# Patient Record
Sex: Female | Born: 1990 | Race: White | Hispanic: No | Marital: Single | Smoking: Current some day smoker
Health system: Southern US, Community
[De-identification: ages and names within clinical notes are randomized; demographics above are authoritative.]

## PROBLEM LIST (undated history)

## (undated) DIAGNOSIS — F32A Depression, unspecified: Secondary | ICD-10-CM

## (undated) DIAGNOSIS — F419 Anxiety disorder, unspecified: Secondary | ICD-10-CM

## (undated) DIAGNOSIS — M797 Fibromyalgia: Secondary | ICD-10-CM

## (undated) DIAGNOSIS — F329 Major depressive disorder, single episode, unspecified: Secondary | ICD-10-CM

## (undated) DIAGNOSIS — S62109A Fracture of unspecified carpal bone, unspecified wrist, initial encounter for closed fracture: Secondary | ICD-10-CM

---

## 2013-10-13 HISTORY — PX: OTHER SURGICAL HISTORY: SHX169

## 2013-10-14 ENCOUNTER — Encounter (HOSPITAL_COMMUNITY): Admission: EM | Disposition: A | Discharge status: Rehabilitation Facility | Payer: Self-pay | Source: Home / Self Care

## 2013-10-14 ENCOUNTER — Observation Stay (HOSPITAL_COMMUNITY): Payer: No Typology Code available for payment source

## 2013-10-14 ENCOUNTER — Emergency Department (HOSPITAL_COMMUNITY): Payer: No Typology Code available for payment source

## 2013-10-14 ENCOUNTER — Encounter (HOSPITAL_COMMUNITY): Payer: Self-pay | Admitting: Emergency Medicine

## 2013-10-14 ENCOUNTER — Encounter (HOSPITAL_COMMUNITY): Payer: No Typology Code available for payment source | Admitting: Anesthesiology

## 2013-10-14 ENCOUNTER — Observation Stay (HOSPITAL_COMMUNITY): Payer: No Typology Code available for payment source | Admitting: Anesthesiology

## 2013-10-14 ENCOUNTER — Inpatient Hospital Stay (HOSPITAL_COMMUNITY)
Admission: EM | Admit: 2013-10-14 | Discharge: 2013-10-20 | DRG: 956 | Disposition: A | Discharge status: Rehabilitation Facility | Payer: No Typology Code available for payment source | Attending: General Surgery | Admitting: General Surgery

## 2013-10-14 ENCOUNTER — Inpatient Hospital Stay (HOSPITAL_COMMUNITY): Payer: No Typology Code available for payment source

## 2013-10-14 DIAGNOSIS — R338 Other retention of urine: Secondary | ICD-10-CM | POA: Diagnosis present

## 2013-10-14 DIAGNOSIS — R339 Retention of urine, unspecified: Secondary | ICD-10-CM | POA: Diagnosis not present

## 2013-10-14 DIAGNOSIS — S72309A Unspecified fracture of shaft of unspecified femur, initial encounter for closed fracture: Principal | ICD-10-CM | POA: Diagnosis present

## 2013-10-14 DIAGNOSIS — M959 Acquired deformity of musculoskeletal system, unspecified: Secondary | ICD-10-CM | POA: Diagnosis present

## 2013-10-14 DIAGNOSIS — S7292XA Unspecified fracture of left femur, initial encounter for closed fracture: Secondary | ICD-10-CM

## 2013-10-14 DIAGNOSIS — S92001A Unspecified fracture of right calcaneus, initial encounter for closed fracture: Secondary | ICD-10-CM

## 2013-10-14 DIAGNOSIS — F17200 Nicotine dependence, unspecified, uncomplicated: Secondary | ICD-10-CM | POA: Diagnosis present

## 2013-10-14 DIAGNOSIS — S42009A Fracture of unspecified part of unspecified clavicle, initial encounter for closed fracture: Secondary | ICD-10-CM

## 2013-10-14 DIAGNOSIS — S92101A Unspecified fracture of right talus, initial encounter for closed fracture: Secondary | ICD-10-CM

## 2013-10-14 DIAGNOSIS — S72302A Unspecified fracture of shaft of left femur, initial encounter for closed fracture: Secondary | ICD-10-CM

## 2013-10-14 DIAGNOSIS — I498 Other specified cardiac arrhythmias: Secondary | ICD-10-CM | POA: Diagnosis present

## 2013-10-14 DIAGNOSIS — S92009A Unspecified fracture of unspecified calcaneus, initial encounter for closed fracture: Secondary | ICD-10-CM | POA: Diagnosis present

## 2013-10-14 DIAGNOSIS — S92102A Unspecified fracture of left talus, initial encounter for closed fracture: Secondary | ICD-10-CM

## 2013-10-14 DIAGNOSIS — F1210 Cannabis abuse, uncomplicated: Secondary | ICD-10-CM | POA: Diagnosis present

## 2013-10-14 DIAGNOSIS — E669 Obesity, unspecified: Secondary | ICD-10-CM | POA: Insufficient documentation

## 2013-10-14 DIAGNOSIS — IMO0001 Reserved for inherently not codable concepts without codable children: Secondary | ICD-10-CM

## 2013-10-14 DIAGNOSIS — S92109A Unspecified fracture of unspecified talus, initial encounter for closed fracture: Secondary | ICD-10-CM | POA: Diagnosis present

## 2013-10-14 DIAGNOSIS — S2691XA Contusion of heart, unspecified with or without hemopericardium, initial encounter: Secondary | ICD-10-CM

## 2013-10-14 DIAGNOSIS — Z6841 Body Mass Index (BMI) 40.0 and over, adult: Secondary | ICD-10-CM

## 2013-10-14 DIAGNOSIS — S7290XA Unspecified fracture of unspecified femur, initial encounter for closed fracture: Secondary | ICD-10-CM

## 2013-10-14 HISTORY — DX: Fracture of unspecified carpal bone, unspecified wrist, initial encounter for closed fracture: S62.109A

## 2013-10-14 HISTORY — PX: FEMUR IM NAIL: SHX1597

## 2013-10-14 LAB — CBC WITH DIFFERENTIAL/PLATELET
Basophils Absolute: 0 10*3/uL (ref 0.0–0.1)
Eosinophils Absolute: 0.2 10*3/uL (ref 0.0–0.7)
Eosinophils Relative: 2 % (ref 0–5)
Hemoglobin: 14.3 g/dL (ref 12.0–15.0)
Lymphocytes Relative: 28 % (ref 12–46)
Lymphs Abs: 4.5 10*3/uL — ABNORMAL HIGH (ref 0.7–4.0)
MCV: 87.4 fL (ref 78.0–100.0)
Neutrophils Relative %: 65 % (ref 43–77)
Platelets: 253 10*3/uL (ref 150–400)
RDW: 13.1 % (ref 11.5–15.5)
WBC: 15.8 10*3/uL — ABNORMAL HIGH (ref 4.0–10.5)

## 2013-10-14 LAB — COMPREHENSIVE METABOLIC PANEL
ALT: 30 U/L (ref 0–35)
AST: 39 U/L — ABNORMAL HIGH (ref 0–37)
Alkaline Phosphatase: 66 U/L (ref 39–117)
Calcium: 9.2 mg/dL (ref 8.4–10.5)
Creatinine, Ser: 0.84 mg/dL (ref 0.50–1.10)
GFR calc Af Amer: >90 mL/min (ref >90)
Glucose, Bld: 99 mg/dL (ref 70–99)
Potassium: 4.3 mEq/L (ref 3.5–5.1)
Sodium: 139 mEq/L (ref 135–145)
Total Bilirubin: 0.3 mg/dL (ref 0.3–1.2)
Total Protein: 6.8 g/dL (ref 6.0–8.3)

## 2013-10-14 LAB — POCT I-STAT, CHEM 8
BUN: 16 mg/dL (ref 6–23)
Chloride: 106 mEq/L (ref 96–112)
Creatinine, Ser: 1.1 mg/dL (ref 0.50–1.10)
HCT: 43 % (ref 36.0–46.0)
Potassium: 4.4 mEq/L (ref 3.5–5.1)
Sodium: 141 mEq/L (ref 135–145)

## 2013-10-14 LAB — CK TOTAL AND CKMB (NOT AT ARMC)
CK, MB: 6.8 ng/mL (ref 0.3–4.0)
Relative Index: 0.7 (ref 0.0–2.5)
Total CK: 993 U/L — ABNORMAL HIGH (ref 7–177)

## 2013-10-14 LAB — URINALYSIS, ROUTINE W REFLEX MICROSCOPIC
Bilirubin Urine: NEGATIVE
Glucose, UA: NEGATIVE mg/dL
Ketones, ur: NEGATIVE mg/dL
Nitrite: NEGATIVE
Protein, ur: NEGATIVE mg/dL
Specific Gravity, Urine: 1.031 — ABNORMAL HIGH (ref 1.005–1.030)
pH: 6 (ref 5.0–8.0)

## 2013-10-14 LAB — SAMPLE TO BLOOD BANK

## 2013-10-14 LAB — URINE MICROSCOPIC-ADD ON

## 2013-10-14 LAB — ETHANOL: Alcohol, Ethyl (B): <11 mg/dL (ref 0–11)

## 2013-10-14 LAB — POCT PREGNANCY, URINE: Preg Test, Ur: NEGATIVE

## 2013-10-14 LAB — CDS SEROLOGY

## 2013-10-14 SURGERY — INSERTION, INTRAMEDULLARY ROD, FEMUR
Anesthesia: General | Site: Leg Upper | Laterality: Left | Wound class: Clean

## 2013-10-14 SURGERY — CANCELLED PROCEDURE
Laterality: Left

## 2013-10-14 MED ORDER — ONDANSETRON HCL 4 MG PO TABS: 4.0000 mg | ORAL_TABLET | Freq: Four times a day (QID) | ORAL | Status: DC | PRN

## 2013-10-14 MED ORDER — TETANUS-DIPHTH-ACELL PERTUSSIS 5-2.5-18.5 LF-MCG/0.5 IM SUSP
0.5000 mL | Freq: Once | INTRAMUSCULAR | Status: AC
  Administered 2013-10-14: 0.5 mL via INTRAMUSCULAR

## 2013-10-14 MED ORDER — WARFARIN - PHARMACIST DOSING INPATIENT
Freq: Every day | Status: DC
  Administered 2013-10-18: 18:00:00

## 2013-10-14 MED ORDER — MORPHINE SULFATE 4 MG/ML IJ SOLN: 4.0000 mg | Freq: Once | INTRAMUSCULAR | Status: AC

## 2013-10-14 MED ORDER — ONDANSETRON HCL 4 MG/2ML IJ SOLN: 4.0000 mg | Freq: Once | INTRAMUSCULAR | Status: DC | PRN

## 2013-10-14 MED ORDER — LACTATED RINGERS IV SOLN
INTRAVENOUS | Status: DC | PRN
  Administered 2013-10-14: 10:00:00 via INTRAVENOUS

## 2013-10-14 MED ORDER — HYDROMORPHONE HCL PF 1 MG/ML IJ SOLN
INTRAMUSCULAR | Status: AC
  Filled 2013-10-14: qty 1

## 2013-10-14 MED ORDER — ROCURONIUM BROMIDE 100 MG/10ML IV SOLN
INTRAVENOUS | Status: DC | PRN
  Administered 2013-10-14: 10 mg via INTRAVENOUS
  Administered 2013-10-14: 30 mg via INTRAVENOUS
  Administered 2013-10-14: 10 mg via INTRAVENOUS

## 2013-10-14 MED ORDER — WARFARIN VIDEO
Freq: Once | Status: AC
  Administered 2013-10-14: 17:00:00

## 2013-10-14 MED ORDER — MORPHINE SULFATE 2 MG/ML IJ SOLN
INTRAMUSCULAR | Status: AC
  Administered 2013-10-14: 4 mg via INTRAVENOUS
  Filled 2013-10-14: qty 2

## 2013-10-14 MED ORDER — HYDROMORPHONE HCL PF 1 MG/ML IJ SOLN: 0.5000 mg | INTRAMUSCULAR | Status: DC | PRN

## 2013-10-14 MED ORDER — ONDANSETRON HCL 4 MG/2ML IJ SOLN: 4.0000 mg | Freq: Four times a day (QID) | INTRAMUSCULAR | Status: DC | PRN

## 2013-10-14 MED ORDER — 0.9 % SODIUM CHLORIDE (POUR BTL) OPTIME
TOPICAL | Status: DC | PRN
  Administered 2013-10-14: 1000 mL

## 2013-10-14 MED ORDER — HYDROMORPHONE HCL PF 1 MG/ML IJ SOLN
1.0000 mg | INTRAMUSCULAR | Status: DC | PRN
  Administered 2013-10-14 (×2): 1 mg via INTRAVENOUS
  Filled 2013-10-14 (×2): qty 1

## 2013-10-14 MED ORDER — CEFAZOLIN SODIUM-DEXTROSE 2-3 GM-% IV SOLR
INTRAVENOUS | Status: AC
  Administered 2013-10-14: 3 g via INTRAVENOUS
  Filled 2013-10-14: qty 50

## 2013-10-14 MED ORDER — OXYCODONE-ACETAMINOPHEN 5-325 MG PO TABS
1.0000 | ORAL_TABLET | ORAL | Status: DC | PRN
  Administered 2013-10-14 – 2013-10-15 (×4): 2 via ORAL
  Filled 2013-10-14 (×4): qty 2

## 2013-10-14 MED ORDER — PANTOPRAZOLE SODIUM 40 MG PO TBEC: 40.0000 mg | DELAYED_RELEASE_TABLET | Freq: Every day | ORAL | Status: DC

## 2013-10-14 MED ORDER — SODIUM CHLORIDE 0.9 % IV SOLN
INTRAVENOUS | Status: AC | PRN
  Administered 2013-10-14 (×3): 1000 mL via INTRAVENOUS

## 2013-10-14 MED ORDER — DEXTROSE-NACL 5-0.9 % IV SOLN
INTRAVENOUS | Status: DC
  Administered 2013-10-14: 06:00:00 via INTRAVENOUS

## 2013-10-14 MED ORDER — SUCCINYLCHOLINE CHLORIDE 20 MG/ML IJ SOLN
INTRAMUSCULAR | Status: DC | PRN
  Administered 2013-10-14: 100 mg via INTRAVENOUS

## 2013-10-14 MED ORDER — MORPHINE SULFATE 2 MG/ML IJ SOLN
INTRAMUSCULAR | Status: AC | PRN
  Administered 2013-10-14 (×4): 4 mg via INTRAVENOUS

## 2013-10-14 MED ORDER — PROPOFOL 10 MG/ML IV BOLUS
INTRAVENOUS | Status: DC | PRN
  Administered 2013-10-14 (×2): 20 mg via INTRAVENOUS
  Administered 2013-10-14: 200 mg via INTRAVENOUS

## 2013-10-14 MED ORDER — CEFAZOLIN SODIUM 1-5 GM-% IV SOLN
INTRAVENOUS | Status: AC
  Filled 2013-10-14: qty 50

## 2013-10-14 MED ORDER — HYDROMORPHONE HCL PF 1 MG/ML IJ SOLN
INTRAMUSCULAR | Status: AC
  Administered 2013-10-14: 0.5 mg via INTRAVENOUS
  Filled 2013-10-14: qty 1

## 2013-10-14 MED ORDER — METOCLOPRAMIDE HCL 5 MG PO TABS
5.0000 mg | ORAL_TABLET | Freq: Three times a day (TID) | ORAL | Status: DC | PRN
Start: 2013-10-14 — End: 2013-10-20

## 2013-10-14 MED ORDER — METOCLOPRAMIDE HCL 5 MG/ML IJ SOLN: 5.0000 mg | Freq: Three times a day (TID) | INTRAMUSCULAR | Status: DC | PRN

## 2013-10-14 MED ORDER — HYDROMORPHONE HCL PF 1 MG/ML IJ SOLN
INTRAMUSCULAR | Status: AC | PRN
  Administered 2013-10-14: 1 mg
  Administered 2013-10-14: 1 mg via INTRAVENOUS

## 2013-10-14 MED ORDER — IOHEXOL 300 MG/ML  SOLN: 100.0000 mL | Freq: Once | INTRAMUSCULAR | Status: AC | PRN

## 2013-10-14 MED ORDER — HYDROMORPHONE HCL PF 1 MG/ML IJ SOLN
0.2500 mg | INTRAMUSCULAR | Status: DC | PRN
  Administered 2013-10-14 (×6): 0.5 mg via INTRAVENOUS

## 2013-10-14 MED ORDER — COUMADIN BOOK
Freq: Once | Status: AC
  Administered 2013-10-14: 17:00:00
  Filled 2013-10-14: qty 1

## 2013-10-14 MED ORDER — NEOSTIGMINE METHYLSULFATE 1 MG/ML IJ SOLN
INTRAMUSCULAR | Status: DC | PRN
  Administered 2013-10-14: 3 mg via INTRAVENOUS

## 2013-10-14 MED ORDER — MORPHINE SULFATE 2 MG/ML IJ SOLN
INTRAMUSCULAR | Status: AC
  Filled 2013-10-14: qty 2

## 2013-10-14 MED ORDER — LACTATED RINGERS IV SOLN
INTRAVENOUS | Status: DC
  Administered 2013-10-14: 10:00:00 via INTRAVENOUS

## 2013-10-14 MED ORDER — SODIUM CHLORIDE 0.9 % IV SOLN
INTRAVENOUS | Status: DC
  Administered 2013-10-14: 10 mL/h via INTRAVENOUS

## 2013-10-14 MED ORDER — TETANUS-DIPHTH-ACELL PERTUSSIS 5-2.5-18.5 LF-MCG/0.5 IM SUSP
INTRAMUSCULAR | Status: AC
  Filled 2013-10-14: qty 0.5

## 2013-10-14 MED ORDER — CEFAZOLIN SODIUM-DEXTROSE 2-3 GM-% IV SOLR
2.0000 g | Freq: Four times a day (QID) | INTRAVENOUS | Status: AC
  Administered 2013-10-14 – 2013-10-15 (×3): 2 g via INTRAVENOUS
  Filled 2013-10-14 (×4): qty 50

## 2013-10-14 MED ORDER — FENTANYL CITRATE 0.05 MG/ML IJ SOLN
INTRAMUSCULAR | Status: DC | PRN
  Administered 2013-10-14 (×7): 50 ug via INTRAVENOUS

## 2013-10-14 MED ORDER — MORPHINE SULFATE 2 MG/ML IJ SOLN
INTRAMUSCULAR | Status: AC
  Filled 2013-10-14: qty 1

## 2013-10-14 MED ORDER — ONDANSETRON HCL 4 MG/2ML IJ SOLN
INTRAMUSCULAR | Status: DC | PRN
  Administered 2013-10-14: 4 mg via INTRAVENOUS

## 2013-10-14 MED ORDER — PANTOPRAZOLE SODIUM 40 MG IV SOLR
40.0000 mg | Freq: Every day | INTRAVENOUS | Status: DC
  Filled 2013-10-14: qty 40

## 2013-10-14 MED ORDER — HYDROMORPHONE HCL PF 1 MG/ML IJ SOLN: 0.2500 mg | INTRAMUSCULAR | Status: DC | PRN

## 2013-10-14 MED ORDER — TETANUS-DIPHTHERIA TOXOIDS TD 5-2 LFU IM INJ: 0.5000 mL | INJECTION | Freq: Once | INTRAMUSCULAR | Status: DC

## 2013-10-14 MED ORDER — GLYCOPYRROLATE 0.2 MG/ML IJ SOLN
INTRAMUSCULAR | Status: DC | PRN
  Administered 2013-10-14: 0.4 mg via INTRAVENOUS

## 2013-10-14 MED ORDER — WARFARIN SODIUM 7.5 MG PO TABS
7.5000 mg | ORAL_TABLET | Freq: Once | ORAL | Status: AC
  Administered 2013-10-14: 7.5 mg via ORAL
  Filled 2013-10-14 (×2): qty 1

## 2013-10-14 MED ORDER — LIDOCAINE HCL (CARDIAC) 20 MG/ML IV SOLN
INTRAVENOUS | Status: DC | PRN
  Administered 2013-10-14: 100 mg via INTRAVENOUS

## 2013-10-14 MED ORDER — HYDROMORPHONE HCL PF 1 MG/ML IJ SOLN
1.0000 mg | Freq: Once | INTRAMUSCULAR | Status: AC
  Administered 2013-10-14: 1 mg via INTRAVENOUS
  Filled 2013-10-14: qty 1

## 2013-10-14 MED ORDER — HYDROMORPHONE HCL PF 1 MG/ML IJ SOLN
INTRAMUSCULAR | Status: AC
  Administered 2013-10-14: 1 mg
  Filled 2013-10-14: qty 1

## 2013-10-14 SURGICAL SUPPLY — 49 items
BIT DRILL LONG 4.0 (BIT) ×1 IMPLANT
BIT DRILL SHORT 4.0 (BIT) ×1 IMPLANT
BLADE SURG 15 STRL LF DISP TIS (BLADE) ×1 IMPLANT
BLADE SURG 15 STRL SS (BLADE) ×1
CLOTH BEACON ORANGE TIMEOUT ST (SAFETY) IMPLANT
COVER SURGICAL LIGHT HANDLE (MISCELLANEOUS) ×2 IMPLANT
COVER TABLE BACK 60X90 (DRAPES) IMPLANT
DRAPE C-ARM 42X72 X-RAY (DRAPES) IMPLANT
DRAPE STERI IOBAN 125X83 (DRAPES) ×2 IMPLANT
DRILL BIT LONG 4.0 (BIT) ×2
DRILL BIT SHORT 4.0 (BIT) ×1
DRSG ADAPTIC 3X8 NADH LF (GAUZE/BANDAGES/DRESSINGS) IMPLANT
DRSG MEPILEX BORDER 4X12 (GAUZE/BANDAGES/DRESSINGS) ×2 IMPLANT
DRSG MEPILEX BORDER 4X4 (GAUZE/BANDAGES/DRESSINGS) IMPLANT
DRSG MEPILEX BORDER 4X8 (GAUZE/BANDAGES/DRESSINGS) IMPLANT
ELECT REM PT RETURN 9FT ADLT (ELECTROSURGICAL) ×2
ELECTRODE REM PT RTRN 9FT ADLT (ELECTROSURGICAL) ×1 IMPLANT
EVACUATOR 1/8 PVC DRAIN (DRAIN) IMPLANT
GLOVE BIO SURGEON STRL SZ 6.5 (GLOVE) ×2 IMPLANT
GLOVE BIOGEL PI IND STRL 7.0 (GLOVE) ×1 IMPLANT
GLOVE BIOGEL PI IND STRL 7.5 (GLOVE) ×1 IMPLANT
GLOVE BIOGEL PI IND STRL 9 (GLOVE) ×1 IMPLANT
GLOVE BIOGEL PI INDICATOR 7.0 (GLOVE) ×1
GLOVE BIOGEL PI INDICATOR 7.5 (GLOVE) ×1
GLOVE BIOGEL PI INDICATOR 9 (GLOVE) ×1
GLOVE ECLIPSE 6.5 STRL STRAW (GLOVE) ×2 IMPLANT
GLOVE SURG ORTHO 9.0 STRL STRW (GLOVE) ×2 IMPLANT
GOWN PREVENTION PLUS XLARGE (GOWN DISPOSABLE) IMPLANT
GOWN SRG XL XLNG 56XLVL 4 (GOWN DISPOSABLE) ×3 IMPLANT
GOWN STRL NON-REIN XL XLG LVL4 (GOWN DISPOSABLE) ×3
GUIDE PIN 3.2MM (MISCELLANEOUS) ×1
GUIDE PIN ORTH 343X3.2XBRAD (MISCELLANEOUS) ×1 IMPLANT
GUIDE ROD 3.0 (MISCELLANEOUS) ×2
KIT BASIN OR (CUSTOM PROCEDURE TRAY) ×2 IMPLANT
KIT ROOM TURNOVER OR (KITS) ×2 IMPLANT
MANIFOLD NEPTUNE II (INSTRUMENTS) ×2 IMPLANT
NAIL TAN 10.X34 (Nail) ×2 IMPLANT
NS IRRIG 1000ML POUR BTL (IV SOLUTION) ×2 IMPLANT
PACK GENERAL/GYN (CUSTOM PROCEDURE TRAY) ×2 IMPLANT
PAD ARMBOARD 7.5X6 YLW CONV (MISCELLANEOUS) ×4 IMPLANT
ROD GUIDE 3.0 (MISCELLANEOUS) ×1 IMPLANT
SCREW TRIGEN LOW PROF 5.0X60 (Screw) ×2 IMPLANT
STAPLER VISISTAT 35W (STAPLE) ×2 IMPLANT
SUT VIC AB 0 CT1 27 (SUTURE) ×2
SUT VIC AB 0 CT1 27XBRD ANBCTR (SUTURE) ×2 IMPLANT
SUT VIC AB 1 CT1 27 (SUTURE) ×1
SUT VIC AB 1 CT1 27XBRD ANBCTR (SUTURE) ×1 IMPLANT
SUT VIC AB 2-0 CTB1 (SUTURE) ×2 IMPLANT
WATER STERILE IRR 1000ML POUR (IV SOLUTION) IMPLANT

## 2013-10-14 SURGICAL SUPPLY — 28 items
CLOTH BEACON ORANGE TIMEOUT ST (SAFETY) ×2 IMPLANT
COVER SURGICAL LIGHT HANDLE (MISCELLANEOUS) ×2 IMPLANT
DRAPE PROXIMA HALF (DRAPES) ×4 IMPLANT
DRAPE STERI IOBAN 125X83 (DRAPES) IMPLANT
DRSG ADAPTIC 3X8 NADH LF (GAUZE/BANDAGES/DRESSINGS) ×2 IMPLANT
DRSG MEPILEX BORDER 4X4 (GAUZE/BANDAGES/DRESSINGS) ×2 IMPLANT
DRSG MEPILEX BORDER 4X8 (GAUZE/BANDAGES/DRESSINGS) IMPLANT
DURAPREP 26ML APPLICATOR (WOUND CARE) ×2 IMPLANT
ELECT REM PT RETURN 9FT ADLT (ELECTROSURGICAL) ×2
ELECTRODE REM PT RTRN 9FT ADLT (ELECTROSURGICAL) ×1 IMPLANT
EVACUATOR 1/8 PVC DRAIN (DRAIN) IMPLANT
GLOVE BIO SURGEON STRL SZ 6.5 (GLOVE) ×2 IMPLANT
GLOVE SURG SS PI 8.0 STRL IVOR (GLOVE) ×4 IMPLANT
GOWN EXTRA PROTECTION XL (GOWNS) ×2 IMPLANT
GOWN STRL NON-REIN LRG LVL3 (GOWN DISPOSABLE) ×6 IMPLANT
KIT BASIN OR (CUSTOM PROCEDURE TRAY) ×2 IMPLANT
KIT ROOM TURNOVER OR (KITS) ×2 IMPLANT
MANIFOLD NEPTUNE II (INSTRUMENTS) ×2 IMPLANT
NS IRRIG 1000ML POUR BTL (IV SOLUTION) ×2 IMPLANT
PACK GENERAL/GYN (CUSTOM PROCEDURE TRAY) ×2 IMPLANT
PAD ARMBOARD 7.5X6 YLW CONV (MISCELLANEOUS) ×4 IMPLANT
SPONGE GAUZE 4X4 12PLY (GAUZE/BANDAGES/DRESSINGS) ×2 IMPLANT
STAPLER VISISTAT (STAPLE) ×2 IMPLANT
SUT VIC AB 0 CTB1 27 (SUTURE) ×4 IMPLANT
SUT VIC AB 1 CTB1 27 (SUTURE) ×4 IMPLANT
SUT VIC AB 2-0 CTB1 (SUTURE) ×4 IMPLANT
TAPE STRIPS DRAPE STRL (GAUZE/BANDAGES/DRESSINGS) ×2 IMPLANT
WATER STERILE IRR 1000ML POUR (IV SOLUTION) ×6 IMPLANT

## 2013-10-14 NOTE — Progress Notes (Signed)
Orthopedic Tech Progress Note Patient Details:  Amy Clark August 05, 1991 [id number]   bucks traction 10lbs   Amy Clark 10/14/2013, 3:40 AM
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
## 2013-10-14 NOTE — Progress Notes (Signed)
UR completed 
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
## 2013-10-14 NOTE — Preoperative (Signed)
Beta Blockers   Reason not to administer Beta Blockers:Not Applicable 

## 2013-10-14 NOTE — Progress Notes (Signed)
Give more dilaudid per Dr Katrinka Blazing
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
## 2013-10-14 NOTE — ED Notes (Signed)
Foley catheter inserted using sterile technique; clear urine returned

## 2013-10-14 NOTE — Anesthesia Postprocedure Evaluation (Signed)
  Anesthesia Post-op Note  Patient: Amy Clark  Procedure(s) Performed: Procedure(s): INTRAMEDULLARY (IM) NAIL FEMORAL (Left)  Patient Location: PACU  Anesthesia Type:General  Level of Consciousness: awake, alert , oriented and patient cooperative  Airway and Oxygen Therapy: Patient Spontanous Breathing  Post-op Pain: moderate  Post-op Assessment: Post-op Vital signs reviewed, Patient's Cardiovascular Status Stable, Respiratory Function Stable, Patent Airway, No signs of Nausea or vomiting and Pain level controlled  Post-op Vital Signs: stable  Complications: No apparent anesthesia complications

## 2013-10-14 NOTE — Progress Notes (Signed)
Pt was alert and talking when I arrived. Talked w/pt's boyfriend who was in car w/pt but said he was ok. Pt's mother and father arrived during visit. Pt's boyfriend and parents were very supportive and comforting to pt. Pt and family were appreciative of visit and Chaplain support. Marjory Lies Chaplain  10/14/13 0100  Clinical Encounter Type  Visited With Patient and family together
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
## 2013-10-14 NOTE — Anesthesia Preprocedure Evaluation (Signed)
Anesthesia Evaluation  Patient identified by MRN, date of birth, ID band Patient awake    Reviewed: Allergy & Precautions, H&P , NPO status , Patient's Chart, lab work & pertinent test results  Airway       Dental   Pulmonary Current Smoker,          Cardiovascular     Neuro/Psych    GI/Hepatic   Endo/Other  Morbid obesity  Renal/GU      Musculoskeletal   Abdominal   Peds  Hematology   Anesthesia Other Findings   Reproductive/Obstetrics                           Anesthesia Physical Anesthesia Plan  ASA: II  Anesthesia Plan: General   Post-op Pain Management:    Induction: Intravenous  Airway Management Planned: Oral ETT  Additional Equipment:   Intra-op Plan:   Post-operative Plan: Extubation in OR  Informed Consent: I have reviewed the patients History and Physical, chart, labs and discussed the procedure including the risks, benefits and alternatives for the proposed anesthesia with the patient or authorized representative who has indicated his/her understanding and acceptance.     Plan Discussed with: CRNA, Anesthesiologist and Surgeon  Anesthesia Plan Comments:         Anesthesia Quick Evaluation

## 2013-10-14 NOTE — ED Notes (Signed)
Family at beside. Family given emotional support. Family speaking with Dr. Preston Fleeting regarding plan of care for patient
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
## 2013-10-14 NOTE — Anesthesia Procedure Notes (Signed)
Procedure Name: Intubation Date/Time: 10/14/2013 10:17 AM Performed by: Lovie Chol Pre-anesthesia Checklist: Patient identified, Emergency Drugs available, Suction available, Patient being monitored and Timeout performed Patient Re-evaluated:Patient Re-evaluated prior to inductionOxygen Delivery Method: Circle system utilized Preoxygenation: Pre-oxygenation with 100% oxygen Intubation Type: IV induction Ventilation: Mask ventilation without difficulty Laryngoscope Size: Miller and 2 Grade View: Grade I Tube type: Oral Tube size: 7.5 mm Number of attempts: 1 Airway Equipment and Method: Stylet Placement Confirmation: ETT inserted through vocal cords under direct vision,  positive ETCO2,  CO2 detector and breath sounds checked- equal and bilateral Secured at: 21 cm Tube secured with: Tape Dental Injury: Teeth and Oropharynx as per pre-operative assessment

## 2013-10-14 NOTE — ED Notes (Signed)
Paged Ortho Tech for [employer] Traction with 20lbs.

## 2013-10-14 NOTE — Transfer of Care (Signed)
Immediate Anesthesia Transfer of Care Note  Patient: Amy Clark  Procedure(s) Performed: Procedure(s): INTRAMEDULLARY (IM) NAIL FEMORAL (Left)  Patient Location: PACU  Anesthesia Type:General  Level of Consciousness: awake, oriented and patient cooperative  Airway & Oxygen Therapy: Patient Spontanous Breathing and Patient connected to face mask oxygen  Post-op Assessment: Report given to PACU RN and Post -op Vital signs reviewed and stable  Post vital signs: Reviewed  Complications: No apparent anesthesia complications
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
## 2013-10-14 NOTE — Progress Notes (Signed)
CK-MB fraction not as much of a concern since ratio to 993 is < 2.0%.  Will await CT of her ankle.  This patient has been seen and I agree with the findings and treatment plan.  Marta Lamas. Gae Bon, MD, FACS [phone] (pager) [phone] (direct pager) Trauma Surgeon

## 2013-10-15 ENCOUNTER — Other Ambulatory Visit (HOSPITAL_COMMUNITY): Payer: Self-pay | Admitting: Orthopedic Surgery

## 2013-10-15 DIAGNOSIS — S2691XA Contusion of heart, unspecified with or without hemopericardium, initial encounter: Secondary | ICD-10-CM

## 2013-10-15 DIAGNOSIS — S92101A Unspecified fracture of right talus, initial encounter for closed fracture: Secondary | ICD-10-CM

## 2013-10-15 LAB — PROTIME-INR: INR: 1.07 (ref 0.00–1.49)

## 2013-10-15 MED ORDER — OXYCODONE HCL 5 MG PO TABS
10.0000 mg | ORAL_TABLET | ORAL | Status: DC | PRN
  Administered 2013-10-15 – 2013-10-16 (×4): 20 mg via ORAL
  Administered 2013-10-16: 15 mg via ORAL
  Administered 2013-10-16 – 2013-10-20 (×13): 20 mg via ORAL
  Administered 2013-10-20: 10 mg via ORAL
  Filled 2013-10-15 (×10): qty 4
  Filled 2013-10-15: qty 2
  Filled 2013-10-15 (×8): qty 4

## 2013-10-15 MED ORDER — DOCUSATE SODIUM 100 MG PO CAPS
100.0000 mg | ORAL_CAPSULE | Freq: Two times a day (BID) | ORAL | Status: DC
  Administered 2013-10-15 – 2013-10-20 (×11): 100 mg via ORAL
  Filled 2013-10-15 (×11): qty 1

## 2013-10-15 MED ORDER — WARFARIN SODIUM 7.5 MG PO TABS
7.5000 mg | ORAL_TABLET | Freq: Once | ORAL | Status: AC
  Administered 2013-10-15: 7.5 mg via ORAL
  Filled 2013-10-15: qty 1

## 2013-10-15 MED ORDER — HYDROMORPHONE HCL PF 1 MG/ML IJ SOLN
0.5000 mg | INTRAMUSCULAR | Status: DC | PRN
  Administered 2013-10-15 – 2013-10-17 (×5): 0.5 mg via INTRAVENOUS
  Filled 2013-10-15 (×5): qty 1

## 2013-10-15 MED ORDER — DEXTROSE 5 % IV SOLN
3.0000 g | INTRAVENOUS | Status: AC
  Administered 2013-10-16: 3 g via INTRAVENOUS
  Filled 2013-10-15: qty 3000

## 2013-10-15 MED ORDER — BETHANECHOL CHLORIDE 25 MG PO TABS
25.0000 mg | ORAL_TABLET | Freq: Four times a day (QID) | ORAL | Status: DC
  Administered 2013-10-15 – 2013-10-19 (×18): 25 mg via ORAL
  Filled 2013-10-15 (×26): qty 1

## 2013-10-15 MED ORDER — POLYETHYLENE GLYCOL 3350 17 G PO PACK
17.0000 g | PACK | Freq: Every day | ORAL | Status: DC
  Administered 2013-10-15 – 2013-10-20 (×5): 17 g via ORAL
  Filled 2013-10-15 (×6): qty 1

## 2013-10-15 MED ORDER — ENOXAPARIN SODIUM 40 MG/0.4ML ~~LOC~~ SOLN
40.0000 mg | Freq: Two times a day (BID) | SUBCUTANEOUS | Status: DC
  Administered 2013-10-15 – 2013-10-20 (×8): 40 mg via SUBCUTANEOUS
  Filled 2013-10-15 (×14): qty 0.4
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
## 2013-10-15 NOTE — Progress Notes (Signed)
Good pain control. NWB RLE with talus and calcaneus FXs. Dr. Lajoyce Corners managing that as well. Patient examined and I agree with the assessment and plan  Violeta Gelinas, MD, MPH, FACS Pager: [phone]  10/15/2013 11:53 AM

## 2013-10-15 NOTE — Progress Notes (Signed)
Patient ID: Amy Clark, female   DOB: [birth year], 22 y.o.   MRN: [id number] CT scan of the right hindfoot was reviewed. Patient has a fracture of the sustentaculum tali of the calcaneus. There is a significant amount comminution and displacement.  I reviewed this with the patient discussed nonoperative treatment versus open reduction internal fixation. Discussed that the fracture fragments are directly adjacent to the neurovascular structures. Discussed that with retraction of the neurovascular structures there is a risk of numbness on the bottom of her foot for possibly 6-12 months. Discussed his risk of infection risk of nonhealing of the bone. Discussed risk of wound healing. Patient states she understands she states she wished to proceed with surgical intervention at this time.  We will plan for open reduction internal fixation of the sustentaculum tali Friday afternoon. N.p.o. after midnight tonight.
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
## 2013-10-15 NOTE — Progress Notes (Signed)
Cleared pt.'s IV pump, intake of NS was . Vanice Sarah
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
## 2013-10-16 ENCOUNTER — Encounter (HOSPITAL_COMMUNITY): Payer: No Typology Code available for payment source | Admitting: Anesthesiology

## 2013-10-16 ENCOUNTER — Encounter (HOSPITAL_COMMUNITY): Payer: Self-pay | Admitting: Orthopedic Surgery

## 2013-10-16 ENCOUNTER — Inpatient Hospital Stay (HOSPITAL_COMMUNITY): Payer: No Typology Code available for payment source | Admitting: Anesthesiology

## 2013-10-16 ENCOUNTER — Encounter (HOSPITAL_COMMUNITY): Admission: EM | Disposition: A | Discharge status: Rehabilitation Facility | Payer: Self-pay | Source: Home / Self Care

## 2013-10-16 ENCOUNTER — Inpatient Hospital Stay (HOSPITAL_COMMUNITY): Payer: No Typology Code available for payment source

## 2013-10-16 DIAGNOSIS — R339 Retention of urine, unspecified: Secondary | ICD-10-CM

## 2013-10-16 DIAGNOSIS — S92102A Unspecified fracture of left talus, initial encounter for closed fracture: Secondary | ICD-10-CM

## 2013-10-16 DIAGNOSIS — M25579 Pain in unspecified ankle and joints of unspecified foot: Secondary | ICD-10-CM

## 2013-10-16 DIAGNOSIS — S92001A Unspecified fracture of right calcaneus, initial encounter for closed fracture: Secondary | ICD-10-CM

## 2013-10-16 DIAGNOSIS — R338 Other retention of urine: Secondary | ICD-10-CM | POA: Diagnosis present

## 2013-10-16 HISTORY — PX: ORIF CALCANEOUS FRACTURE: SHX5030

## 2013-10-16 LAB — SURGICAL PCR SCREEN
MRSA, PCR: NEGATIVE
Staphylococcus aureus: POSITIVE — AB

## 2013-10-16 SURGERY — OPEN REDUCTION INTERNAL FIXATION (ORIF) CALCANEOUS FRACTURE
Anesthesia: General | Site: Foot | Laterality: Right | Wound class: Clean

## 2013-10-16 MED ORDER — METOCLOPRAMIDE HCL 5 MG PO TABS: 5.0000 mg | ORAL_TABLET | Freq: Three times a day (TID) | ORAL | Status: DC | PRN

## 2013-10-16 MED ORDER — ONDANSETRON HCL 4 MG PO TABS: 4.0000 mg | ORAL_TABLET | Freq: Four times a day (QID) | ORAL | Status: DC | PRN

## 2013-10-16 MED ORDER — FENTANYL CITRATE 0.05 MG/ML IJ SOLN
INTRAMUSCULAR | Status: DC | PRN
  Administered 2013-10-16: 100 ug via INTRAVENOUS
  Administered 2013-10-16 (×2): 50 ug via INTRAVENOUS
  Administered 2013-10-16 (×2): 100 ug via INTRAVENOUS
  Administered 2013-10-16: 50 ug via INTRAVENOUS

## 2013-10-16 MED ORDER — LACTATED RINGERS IV SOLN
INTRAVENOUS | Status: DC
  Administered 2013-10-16: 18:00:00 via INTRAVENOUS

## 2013-10-16 MED ORDER — METOCLOPRAMIDE HCL 5 MG/ML IJ SOLN: 5.0000 mg | Freq: Three times a day (TID) | INTRAMUSCULAR | Status: DC | PRN

## 2013-10-16 MED ORDER — ONDANSETRON HCL 4 MG/2ML IJ SOLN
INTRAMUSCULAR | Status: DC | PRN
  Administered 2013-10-16: 4 mg via INTRAVENOUS

## 2013-10-16 MED ORDER — MIDAZOLAM HCL 5 MG/5ML IJ SOLN
INTRAMUSCULAR | Status: DC | PRN
  Administered 2013-10-16: 2 mg via INTRAVENOUS

## 2013-10-16 MED ORDER — ALPRAZOLAM 0.5 MG PO TABS
0.5000 mg | ORAL_TABLET | Freq: Three times a day (TID) | ORAL | Status: DC | PRN
  Administered 2013-10-16 – 2013-10-19 (×3): 0.5 mg via ORAL
  Filled 2013-10-16 (×3): qty 1
  Filled 2013-10-16: qty 2

## 2013-10-16 MED ORDER — ONDANSETRON HCL 4 MG/2ML IJ SOLN
4.0000 mg | Freq: Four times a day (QID) | INTRAMUSCULAR | Status: DC | PRN
Start: 2013-10-16 — End: 2013-10-16

## 2013-10-16 MED ORDER — DEXAMETHASONE SODIUM PHOSPHATE 4 MG/ML IJ SOLN
INTRAMUSCULAR | Status: DC | PRN
  Administered 2013-10-16: 4 mg via INTRAVENOUS

## 2013-10-16 MED ORDER — MORPHINE SULFATE 10 MG/ML IJ SOLN
INTRAMUSCULAR | Status: DC | PRN
  Administered 2013-10-16 (×2): 5 mg via INTRAVENOUS

## 2013-10-16 MED ORDER — SUCCINYLCHOLINE CHLORIDE 20 MG/ML IJ SOLN
INTRAMUSCULAR | Status: DC | PRN
  Administered 2013-10-16: 40 mg via INTRAVENOUS

## 2013-10-16 MED ORDER — TAMSULOSIN HCL 0.4 MG PO CAPS
0.4000 mg | ORAL_CAPSULE | Freq: Every day | ORAL | Status: DC
  Administered 2013-10-16 – 2013-10-17 (×2): 0.4 mg via ORAL
  Filled 2013-10-16 (×3): qty 1

## 2013-10-16 MED ORDER — HYDROMORPHONE HCL PF 1 MG/ML IJ SOLN
INTRAMUSCULAR | Status: AC
  Filled 2013-10-16: qty 1

## 2013-10-16 MED ORDER — PROPOFOL 10 MG/ML IV BOLUS
INTRAVENOUS | Status: DC | PRN
  Administered 2013-10-16: 200 mg via INTRAVENOUS

## 2013-10-16 MED ORDER — ONDANSETRON HCL 4 MG/2ML IJ SOLN: 4.0000 mg | Freq: Four times a day (QID) | INTRAMUSCULAR | Status: DC | PRN

## 2013-10-16 MED ORDER — OXYCODONE HCL 5 MG/5ML PO SOLN: 5.0000 mg | Freq: Once | ORAL | Status: DC | PRN

## 2013-10-16 MED ORDER — OXYCODONE HCL 5 MG PO TABS: 5.0000 mg | ORAL_TABLET | Freq: Once | ORAL | Status: DC | PRN

## 2013-10-16 MED ORDER — WARFARIN SODIUM 7.5 MG PO TABS
7.5000 mg | ORAL_TABLET | Freq: Once | ORAL | Status: DC
  Filled 2013-10-16 (×2): qty 1

## 2013-10-16 MED ORDER — HYDROMORPHONE HCL PF 1 MG/ML IJ SOLN
0.2500 mg | INTRAMUSCULAR | Status: DC | PRN
  Administered 2013-10-16: 0.5 mg via INTRAVENOUS

## 2013-10-16 MED ORDER — LIDOCAINE HCL (CARDIAC) 20 MG/ML IV SOLN
INTRAVENOUS | Status: DC | PRN
  Administered 2013-10-16: 100 mg via INTRAVENOUS

## 2013-10-16 MED ORDER — CEFAZOLIN SODIUM-DEXTROSE 2-3 GM-% IV SOLR
2.0000 g | Freq: Four times a day (QID) | INTRAVENOUS | Status: AC
  Administered 2013-10-17 (×3): 2 g via INTRAVENOUS
  Filled 2013-10-16 (×3): qty 50

## 2013-10-16 MED ORDER — TRAMADOL HCL 50 MG PO TABS
100.0000 mg | ORAL_TABLET | Freq: Four times a day (QID) | ORAL | Status: DC
  Administered 2013-10-16 – 2013-10-20 (×15): 100 mg via ORAL
  Filled 2013-10-16 (×15): qty 2

## 2013-10-16 MED ORDER — LACTATED RINGERS IV SOLN
INTRAVENOUS | Status: DC | PRN
  Administered 2013-10-16 (×2): via INTRAVENOUS

## 2013-10-16 MED ORDER — 0.9 % SODIUM CHLORIDE (POUR BTL) OPTIME
TOPICAL | Status: DC | PRN
  Administered 2013-10-16: 1000 mL

## 2013-10-16 MED ORDER — SODIUM CHLORIDE 0.9 % IV SOLN
INTRAVENOUS | Status: DC
  Administered 2013-10-16: 23:00:00 via INTRAVENOUS
  Administered 2013-10-18: 20 mL/h via INTRAVENOUS

## 2013-10-16 SURGICAL SUPPLY — 53 items
BANDAGE ESMARK 6X9 LF (GAUZE/BANDAGES/DRESSINGS) IMPLANT
BANDAGE GAUZE ELAST BULKY 4 IN (GAUZE/BANDAGES/DRESSINGS) ×3 IMPLANT
BIT DRILL CANN 1.9 BIODRIVE (BIT) ×3 IMPLANT
BNDG COHESIVE 4X5 TAN STRL (GAUZE/BANDAGES/DRESSINGS) ×3 IMPLANT
BNDG ESMARK 4X9 LF (GAUZE/BANDAGES/DRESSINGS) ×3 IMPLANT
BNDG ESMARK 6X9 LF (GAUZE/BANDAGES/DRESSINGS)
BNDG GAUZE STRTCH 6 (GAUZE/BANDAGES/DRESSINGS) ×3 IMPLANT
CLOTH BEACON ORANGE TIMEOUT ST (SAFETY) IMPLANT
CLSR STERI-STRIP ANTIMIC 1/2X4 (GAUZE/BANDAGES/DRESSINGS) ×3 IMPLANT
COVER SURGICAL LIGHT HANDLE (MISCELLANEOUS) ×3 IMPLANT
CUFF TOURNIQUET SINGLE 34IN LL (TOURNIQUET CUFF) IMPLANT
CUFF TOURNIQUET SINGLE 44IN (TOURNIQUET CUFF) IMPLANT
DRAPE C-ARM MINI 42X72 WSTRAPS (DRAPES) ×3 IMPLANT
DRAPE INCISE IOBAN 66X45 STRL (DRAPES) ×3 IMPLANT
DRAPE PROXIMA HALF (DRAPES) ×3 IMPLANT
DRAPE U-SHAPE 47X51 STRL (DRAPES) ×3 IMPLANT
DRSG ADAPTIC 3X8 NADH LF (GAUZE/BANDAGES/DRESSINGS) ×3 IMPLANT
DRSG PAD ABDOMINAL 8X10 ST (GAUZE/BANDAGES/DRESSINGS) ×3 IMPLANT
DURAPREP 26ML APPLICATOR (WOUND CARE) ×3 IMPLANT
ELECT REM PT RETURN 9FT ADLT (ELECTROSURGICAL) ×3
ELECTRODE REM PT RTRN 9FT ADLT (ELECTROSURGICAL) ×2 IMPLANT
GLOVE BIOGEL PI IND STRL 6.5 (GLOVE) ×2 IMPLANT
GLOVE BIOGEL PI IND STRL 9 (GLOVE) ×2 IMPLANT
GLOVE BIOGEL PI INDICATOR 6.5 (GLOVE) ×1
GLOVE BIOGEL PI INDICATOR 9 (GLOVE) ×1
GLOVE ECLIPSE 6.0 STRL STRAW (GLOVE) ×3 IMPLANT
GLOVE ECLIPSE 6.5 STRL STRAW (GLOVE) ×3 IMPLANT
GLOVE SURG ORTHO 9.0 STRL STRW (GLOVE) ×3 IMPLANT
GOWN PREVENTION PLUS XLARGE (GOWN DISPOSABLE) ×3 IMPLANT
GOWN SRG XL XLNG 56XLVL 4 (GOWN DISPOSABLE) ×4 IMPLANT
GOWN STRL NON-REIN XL XLG LVL4 (GOWN DISPOSABLE) ×2
K-WIRE 1.2X150 DISP (WIRE) ×3 IMPLANT
KIT BASIN OR (CUSTOM PROCEDURE TRAY) ×3 IMPLANT
KIT ROOM TURNOVER OR (KITS) ×3 IMPLANT
MANIFOLD NEPTUNE II (INSTRUMENTS) ×3 IMPLANT
NS IRRIG 1000ML POUR BTL (IV SOLUTION) ×3 IMPLANT
PACK ORTHO EXTREMITY (CUSTOM PROCEDURE TRAY) ×3 IMPLANT
PAD ARMBOARD 7.5X6 YLW CONV (MISCELLANEOUS) ×6 IMPLANT
PADDING CAST COTTON 6X4 STRL (CAST SUPPLIES) ×3 IMPLANT
SCREW BIODRIVE MICRO 3.0X32 (Screw) ×6 IMPLANT
SPONGE GAUZE 4X4 12PLY (GAUZE/BANDAGES/DRESSINGS) ×3 IMPLANT
SPONGE LAP 18X18 X RAY DECT (DISPOSABLE) ×3 IMPLANT
STAPLER VISISTAT 35W (STAPLE) IMPLANT
SUCTION FRAZIER TIP 10 FR DISP (SUCTIONS) ×3 IMPLANT
SUT ETHILON 2 0 PSLX (SUTURE) IMPLANT
SUT VIC AB 2-0 CT1 27 (SUTURE) ×1
SUT VIC AB 2-0 CT1 TAPERPNT 27 (SUTURE) ×2 IMPLANT
SUT VIC AB 2-0 CTB1 (SUTURE) ×6 IMPLANT
TOWEL OR 17X24 6PK STRL BLUE (TOWEL DISPOSABLE) ×3 IMPLANT
TOWEL OR 17X26 10 PK STRL BLUE (TOWEL DISPOSABLE) ×3 IMPLANT
TUBE CONNECTING 12X1/4 (SUCTIONS) ×3 IMPLANT
WATER STERILE IRR 1000ML POUR (IV SOLUTION) ×3 IMPLANT
YANKAUER SUCT BULB TIP NO VENT (SUCTIONS) ×3 IMPLANT

## 2013-10-16 NOTE — Progress Notes (Signed)
Patient to OR. Family at bedside.

## 2013-10-16 NOTE — Progress Notes (Signed)
The patient also has a left talus fracture.  Will be going to the OR today.  This patient has been seen and I agree with the findings and treatment plan.  Marta Lamas. Gae Bon, MD, FACS [phone] (pager) [phone] (direct pager) Trauma Surgeon

## 2013-10-16 NOTE — Anesthesia Postprocedure Evaluation (Signed)
Anesthesia Post Note  Patient: Amy Clark  Procedure(s) Performed: Procedure(s) (LRB): OPEN REDUCTION INTERNAL FIXATION (ORIF) CALCANEOUS FRACTURE (Right)  Anesthesia type: General  Patient location: PACU  Post pain: Pain level controlled and Adequate analgesia  Post assessment: Post-op Vital signs reviewed, Patient's Cardiovascular Status Stable, Respiratory Function Stable, Patent Airway and Pain level controlled  Last Vitals:  Filed Vitals:   10/16/13 2130  BP: 121/56  Pulse: 102  Temp: 36.4 C  Resp: 19    Post vital signs: Reviewed and stable  Level of consciousness: awake, alert  and oriented  Complications: No apparent anesthesia complications

## 2013-10-16 NOTE — Anesthesia Procedure Notes (Signed)
Procedure Name: LMA Insertion Date/Time: 10/16/2013 7:26 PM Performed by: Orvilla Fus A Pre-anesthesia Checklist: Patient identified, Timeout performed, Emergency Drugs available, Suction available and Patient being monitored Patient Re-evaluated:Patient Re-evaluated prior to inductionOxygen Delivery Method: Circle system utilized Preoxygenation: Pre-oxygenation with 100% oxygen Intubation Type: IV induction Ventilation: Mask ventilation without difficulty LMA: LMA inserted LMA Size: 4.0 Number of attempts: 1 Placement Confirmation: breath sounds checked- equal and bilateral and positive ETCO2 Tube secured with: Tape Dental Injury: Teeth and Oropharynx as per pre-operative assessment

## 2013-10-16 NOTE — Progress Notes (Signed)
Review of the CT scan of the left ankle shows essentially an extra-articular fracture the talus of the posterior facet. Essentially the entire posterior facet is intact and do not feel that surgical intervention is necessary for the posterior facet fracture extra-articular. Patient did have a displaced fracture of the sustentaculum tali the calcaneus and will proceed with open reduction internal fixation. Good effect the patient will need to be weightbearing on this foot for transfers plan for internal fixation to stabilize the sustentacular fracture.
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
## 2013-10-16 NOTE — H&P (View-Only) (Signed)
Patient ID: Amy Clark, female   DOB: 01/17/1991, 22 y.o.   MRN: [id number] CT scan of the right hindfoot was reviewed. Patient has a fracture of the sustentaculum tali of the calcaneus. There is a significant amount comminution and displacement.  I reviewed this with the patient discussed nonoperative treatment versus open reduction internal fixation. Discussed that the fracture fragments are directly adjacent to the neurovascular structures. Discussed that with retraction of the neurovascular structures there is a risk of numbness on the bottom of her foot for possibly 6-12 months. Discussed his risk of infection risk of nonhealing of the bone. Discussed risk of wound healing. Patient states she understands she states she wished to proceed with surgical intervention at this time.  We will plan for open reduction internal fixation of the sustentaculum tali Friday afternoon. N.p.o. after midnight tonight. 
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
## 2013-10-16 NOTE — Progress Notes (Signed)
PT Cancellation Note  Patient Details Name: Amy Clark MRN: [id number] DOB: 12/27/1990   Cancelled Treatment:    Reason Eval/Treat Not Completed: Medical issues which prohibited therapy;Pain limiting ability to participate (pt in 9/10 pain and scheduled to go back to OR in the next few hours for more surgery)   Aeric Burnham LUBECK 10/16/2013, 12:44 PM

## 2013-10-16 NOTE — Progress Notes (Signed)
Rehab Admissions Coordinator Note:  Patient was screened by Trish Mage for appropriateness for an Inpatient Acute Rehab Consult.  Patient is scheduled to go to the OR today.  We need to wait until post op PT/OT evals are done and then we can determine the possible need for inpatient rehab consult.  Call me for questions.   Trish Mage 10/16/2013, 9:08 AM  I can be reached at [phone].

## 2013-10-16 NOTE — Progress Notes (Signed)
0000 Patient unable to void. Patient ask to be In and out cath. Patient was in and out cath for clear yellow urine. Will continue to monitor.

## 2013-10-16 NOTE — Transfer of Care (Signed)
Immediate Anesthesia Transfer of Care Note  Patient: Amy Clark  Procedure(s) Performed: Procedure(s): OPEN REDUCTION INTERNAL FIXATION (ORIF) CALCANEOUS FRACTURE (Right)  Patient Location: PACU  Anesthesia Type:General  Level of Consciousness: awake, alert  and patient cooperative  Airway & Oxygen Therapy: Patient Spontanous Breathing and Patient connected to face mask oxygen  Post-op Assessment: Report given to PACU RN, Post -op Vital signs reviewed and stable and Patient moving all extremities  Post vital signs: Reviewed and stable  Complications: No apparent anesthesia complications

## 2013-10-16 NOTE — Interval H&P Note (Signed)
History and Physical Interval Note:  10/16/2013 6:10 AM  Amy Clark  has presented today for surgery, with the diagnosis of Right Sustentaculum Tali Fracture  The various methods of treatment have been discussed with the patient and family. After consideration of risks, benefits and other options for treatment, the patient has consented to  Procedure(s) with comments: Open Reduction Internal Fixation Right Sustentaculum Tali (Right) - Open Reduction Internal Fixation Right Sustentaculum Tali as a surgical intervention .  The patient's history has been reviewed, patient examined, no change in status, stable for surgery.  I have reviewed the patient's chart and labs.  Questions were answered to the patient's satisfaction.     Genevia Bouldin V

## 2013-10-16 NOTE — Progress Notes (Signed)
Report given to Lisa, RN.

## 2013-10-16 NOTE — Anesthesia Preprocedure Evaluation (Signed)
Anesthesia Evaluation  Patient identified by MRN, date of birth, ID band Patient awake    Reviewed: Allergy & Precautions, H&P , NPO status , Patient's Chart, lab work & pertinent test results  Airway Mallampati: III  Neck ROM: full    Dental   Pulmonary Current Smoker,          Cardiovascular negative cardio ROS      Neuro/Psych    GI/Hepatic   Endo/Other  Morbid obesity  Renal/GU      Musculoskeletal   Abdominal   Peds  Hematology   Anesthesia Other Findings   Reproductive/Obstetrics                           Anesthesia Physical Anesthesia Plan  ASA: II  Anesthesia Plan: General   Post-op Pain Management:    Induction: Intravenous  Airway Management Planned: LMA  Additional Equipment:   Intra-op Plan:   Post-operative Plan:   Informed Consent: I have reviewed the patients History and Physical, chart, labs and discussed the procedure including the risks, benefits and alternatives for the proposed anesthesia with the patient or authorized representative who has indicated his/her understanding and acceptance.     Plan Discussed with: CRNA, Anesthesiologist and Surgeon  Anesthesia Plan Comments:         Anesthesia Quick Evaluation

## 2013-10-17 LAB — PROTIME-INR: Prothrombin Time: 15.7 seconds — ABNORMAL HIGH (ref 11.6–15.2)

## 2013-10-17 MED ORDER — HYDROMORPHONE HCL PF 1 MG/ML IJ SOLN
1.0000 mg | INTRAMUSCULAR | Status: DC | PRN
  Administered 2013-10-17 (×3): 1 mg via INTRAVENOUS
  Administered 2013-10-17 (×2): 2 mg via INTRAVENOUS
  Administered 2013-10-17: 1 mg via INTRAVENOUS
  Administered 2013-10-17: 2 mg via INTRAVENOUS
  Administered 2013-10-17 – 2013-10-18 (×3): 1 mg via INTRAVENOUS
  Administered 2013-10-18 – 2013-10-19 (×8): 2 mg via INTRAVENOUS
  Administered 2013-10-19: 1 mg via INTRAVENOUS
  Administered 2013-10-19 – 2013-10-20 (×7): 2 mg via INTRAVENOUS
  Filled 2013-10-17 (×4): qty 2
  Filled 2013-10-17: qty 1
  Filled 2013-10-17 (×2): qty 2
  Filled 2013-10-17: qty 1
  Filled 2013-10-17 (×2): qty 2
  Filled 2013-10-17: qty 1
  Filled 2013-10-17 (×2): qty 2
  Filled 2013-10-17 (×3): qty 1
  Filled 2013-10-17: qty 2
  Filled 2013-10-17: qty 1
  Filled 2013-10-17 (×7): qty 2

## 2013-10-17 MED ORDER — HYDROMORPHONE HCL PF 1 MG/ML IJ SOLN
INTRAMUSCULAR | Status: AC
  Filled 2013-10-17: qty 2

## 2013-10-17 MED ORDER — WARFARIN SODIUM 10 MG PO TABS
10.0000 mg | ORAL_TABLET | Freq: Once | ORAL | Status: DC
  Filled 2013-10-17: qty 1

## 2013-10-17 MED ORDER — WARFARIN SODIUM 7.5 MG PO TABS
7.5000 mg | ORAL_TABLET | Freq: Once | ORAL | Status: AC
  Administered 2013-10-17: 7.5 mg via ORAL
  Filled 2013-10-17: qty 1

## 2013-10-17 NOTE — Progress Notes (Signed)
Patient ID: Amy Clark, female   DOB: 05-16-1991, 22 y.o.   MRN: [id number] Comfortable this am although feels that the right foot dressing is too tight--dressing changed with sterile technique--"much better". Neuro vascular intact to both LE's. Dressing on left thigh clean, dry. PT as ordered.
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
## 2013-10-17 NOTE — Progress Notes (Signed)
Patient experiencing severe uncontrolled pain after receiving 20mg  Oxy and 0.5mg  Dilaudid.  Dr. Andrey Campanile notified and gave new orders for Dilaudid 1-2mg  q. Hour PRN.
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
## 2013-10-17 NOTE — Progress Notes (Signed)
Patient arrived back to the floor from OR at 21:40.  Family at bedside.  Vital signs stable and patient arousable, but denies pain at this time.  Right foot warm with 2+ pulse and pt able to wiggle toes.  Will continue to monitor.
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
## 2013-10-18 MED ORDER — WARFARIN SODIUM 10 MG PO TABS
10.0000 mg | ORAL_TABLET | Freq: Once | ORAL | Status: AC
  Administered 2013-10-18: 10 mg via ORAL
  Filled 2013-10-18: qty 1

## 2013-10-18 MED ORDER — METHOCARBAMOL 500 MG PO TABS
500.0000 mg | ORAL_TABLET | Freq: Three times a day (TID) | ORAL | Status: DC
  Administered 2013-10-18 (×3): 500 mg via ORAL
  Filled 2013-10-18 (×6): qty 1

## 2013-10-18 MED ORDER — TAMSULOSIN HCL 0.4 MG PO CAPS
0.8000 mg | ORAL_CAPSULE | Freq: Every day | ORAL | Status: DC
  Administered 2013-10-18 – 2013-10-19 (×2): 0.8 mg via ORAL
  Filled 2013-10-18 (×3): qty 2
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
## 2013-10-18 NOTE — Progress Notes (Signed)
ATTENDING ADDENDUM:  I personally reviewed patient's record, examined the patient, and formulated the following assessment and plan: Agree with PA note. Cont bowel regimen.
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
## 2013-10-18 NOTE — Progress Notes (Signed)
Orthopedic Tech Progress Note Patient Details:  Amy Clark Oct 02, 1991 [id number]  Patient ID: Amy Clark, female   DOB: 1991-04-25, 22 y.o.   MRN: [id number] Trapeze bar patient helper  Nikki Dom 10/18/2013, 6:31 PM

## 2013-10-18 NOTE — Progress Notes (Signed)
Patient ID: Amy Clark, female   DOB: 04/21/1991, 22 y.o.   MRN: [id number] Having pain this am mostly from LLE. Leg is not edematous, wounds without drainage or erythema. Sensory intact to both feet. Initial PT yesterday with F/U today-will be bed-chair. Stable from ortho standpoint.

## 2013-10-19 ENCOUNTER — Inpatient Hospital Stay (HOSPITAL_COMMUNITY): Payer: No Typology Code available for payment source

## 2013-10-19 DIAGNOSIS — S92109A Unspecified fracture of unspecified talus, initial encounter for closed fracture: Secondary | ICD-10-CM

## 2013-10-19 DIAGNOSIS — S7290XA Unspecified fracture of unspecified femur, initial encounter for closed fracture: Secondary | ICD-10-CM

## 2013-10-19 MED ORDER — MORPHINE SULFATE ER 30 MG PO TBCR
30.0000 mg | EXTENDED_RELEASE_TABLET | Freq: Two times a day (BID) | ORAL | Status: DC
  Administered 2013-10-19 (×2): 30 mg via ORAL
  Filled 2013-10-19 (×2): qty 2

## 2013-10-19 MED ORDER — WARFARIN SODIUM 2.5 MG PO TABS
12.5000 mg | ORAL_TABLET | Freq: Once | ORAL | Status: DC
  Administered 2013-10-19: 18:00:00 12.5 mg via ORAL
  Filled 2013-10-19: qty 1

## 2013-10-19 MED ORDER — METHOCARBAMOL 750 MG PO TABS
1500.0000 mg | ORAL_TABLET | Freq: Four times a day (QID) | ORAL | Status: DC
  Administered 2013-10-19 – 2013-10-20 (×6): 1500 mg via ORAL
  Filled 2013-10-19 (×8): qty 2
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
## 2013-10-19 NOTE — Progress Notes (Signed)
Orthopedic Tech Progress Note Patient Details:  Amy Clark 1991-08-26 [id number]  Ortho Devices Type of Ortho Device: CAM walker Ortho Device/Splint Interventions: Application   Shawnie Pons 10/19/2013, 10:58 AM

## 2013-10-19 NOTE — Progress Notes (Signed)
[name] WB status clarified with Dr. Lajoyce Corners. Patient requested we call her mother to update her. Patient examined and I agree with the assessment and plan  Amy Gelinas, MD, MPH, FACS Pager: [phone]  10/19/2013 10:14 AM
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
## 2013-10-19 NOTE — Progress Notes (Signed)
Patient ID: Amy Clark, female   DOB: 28-Aug-1991, 22 y.o.   MRN: [id number] Patient complains of left hip pain. Will obtain a radiograph of her left hip. Discussed that she will be essentially nonweightbearing for 3 weeks with transfers only, anticipate full ambulation at 6 weeks.
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
## 2013-10-20 ENCOUNTER — Inpatient Hospital Stay (HOSPITAL_COMMUNITY)
Admission: RE | Admit: 2013-10-20 | Discharge: 2013-10-27 | DRG: 945 | Disposition: A | Discharge status: Home Health | Payer: No Typology Code available for payment source | Source: Intra-hospital | Attending: Physical Medicine & Rehabilitation | Admitting: Physical Medicine & Rehabilitation

## 2013-10-20 ENCOUNTER — Encounter (HOSPITAL_COMMUNITY): Payer: Self-pay | Admitting: Orthopedic Surgery

## 2013-10-20 DIAGNOSIS — T07XXXA Unspecified multiple injuries, initial encounter: Secondary | ICD-10-CM

## 2013-10-20 DIAGNOSIS — S72309A Unspecified fracture of shaft of unspecified femur, initial encounter for closed fracture: Secondary | ICD-10-CM

## 2013-10-20 DIAGNOSIS — F17200 Nicotine dependence, unspecified, uncomplicated: Secondary | ICD-10-CM

## 2013-10-20 DIAGNOSIS — T1490XA Injury, unspecified, initial encounter: Secondary | ICD-10-CM | POA: Diagnosis present

## 2013-10-20 DIAGNOSIS — Z6841 Body Mass Index (BMI) 40.0 and over, adult: Secondary | ICD-10-CM

## 2013-10-20 DIAGNOSIS — S92009A Unspecified fracture of unspecified calcaneus, initial encounter for closed fracture: Secondary | ICD-10-CM

## 2013-10-20 DIAGNOSIS — K5909 Other constipation: Secondary | ICD-10-CM

## 2013-10-20 DIAGNOSIS — Z5189 Encounter for other specified aftercare: Principal | ICD-10-CM

## 2013-10-20 DIAGNOSIS — E669 Obesity, unspecified: Secondary | ICD-10-CM

## 2013-10-20 DIAGNOSIS — T40605A Adverse effect of unspecified narcotics, initial encounter: Secondary | ICD-10-CM

## 2013-10-20 DIAGNOSIS — R339 Retention of urine, unspecified: Secondary | ICD-10-CM

## 2013-10-20 DIAGNOSIS — S92109A Unspecified fracture of unspecified talus, initial encounter for closed fracture: Secondary | ICD-10-CM

## 2013-10-20 DIAGNOSIS — S82843A Displaced bimalleolar fracture of unspecified lower leg, initial encounter for closed fracture: Secondary | ICD-10-CM

## 2013-10-20 DIAGNOSIS — Z9889 Other specified postprocedural states: Secondary | ICD-10-CM

## 2013-10-20 HISTORY — DX: Anxiety disorder, unspecified: F41.9

## 2013-10-20 HISTORY — DX: Major depressive disorder, single episode, unspecified: F32.9

## 2013-10-20 HISTORY — DX: Depression, unspecified: F32.A

## 2013-10-20 HISTORY — DX: Fibromyalgia: M79.7

## 2013-10-20 LAB — PROTIME-INR: Prothrombin Time: 19.6 seconds — ABNORMAL HIGH (ref 11.6–15.2)

## 2013-10-20 MED ORDER — ACETAMINOPHEN 325 MG PO TABS
325.0000 mg | ORAL_TABLET | ORAL | Status: DC | PRN
  Filled 2013-10-20: qty 2

## 2013-10-20 MED ORDER — SORBITOL 70 % SOLN
30.0000 mL | Freq: Every day | Status: DC | PRN
  Administered 2013-10-21 – 2013-10-22 (×2): 30 mL via ORAL
  Filled 2013-10-20 (×2): qty 30

## 2013-10-20 MED ORDER — ENOXAPARIN SODIUM 40 MG/0.4ML ~~LOC~~ SOLN
40.0000 mg | Freq: Two times a day (BID) | SUBCUTANEOUS | Status: DC
  Administered 2013-10-20 – 2013-10-23 (×7): 40 mg via SUBCUTANEOUS
  Filled 2013-10-20 (×11): qty 0.4

## 2013-10-20 MED ORDER — ONDANSETRON HCL 4 MG/2ML IJ SOLN: 4.0000 mg | Freq: Four times a day (QID) | INTRAMUSCULAR | Status: DC | PRN

## 2013-10-20 MED ORDER — WARFARIN SODIUM 5 MG PO TABS
5.0000 mg | ORAL_TABLET | Freq: Once | ORAL | Status: DC
  Filled 2013-10-20: qty 1

## 2013-10-20 MED ORDER — OXYCODONE HCL 5 MG PO TABS
10.0000 mg | ORAL_TABLET | ORAL | Status: DC | PRN
  Administered 2013-10-20 – 2013-10-24 (×6): 20 mg via ORAL
  Administered 2013-10-24: 10 mg via ORAL
  Administered 2013-10-27: 15 mg via ORAL
  Filled 2013-10-20: qty 4
  Filled 2013-10-20: qty 2
  Filled 2013-10-20 (×2): qty 4
  Filled 2013-10-20: qty 3
  Filled 2013-10-20 (×3): qty 4

## 2013-10-20 MED ORDER — WARFARIN - PHARMACIST DOSING INPATIENT: Freq: Every day | Status: DC

## 2013-10-20 MED ORDER — BETHANECHOL CHLORIDE 25 MG PO TABS
25.0000 mg | ORAL_TABLET | Freq: Three times a day (TID) | ORAL | Status: DC
  Administered 2013-10-20 – 2013-10-24 (×11): 25 mg via ORAL
  Filled 2013-10-20 (×15): qty 1

## 2013-10-20 MED ORDER — MORPHINE SULFATE ER 15 MG PO TBCR
30.0000 mg | EXTENDED_RELEASE_TABLET | Freq: Three times a day (TID) | ORAL | Status: DC
  Administered 2013-10-20 – 2013-10-27 (×20): 30 mg via ORAL
  Filled 2013-10-20 (×20): qty 2

## 2013-10-20 MED ORDER — WARFARIN SODIUM 2.5 MG PO TABS: 12.5000 mg | ORAL_TABLET | Freq: Once | ORAL | Status: DC

## 2013-10-20 MED ORDER — BETHANECHOL CHLORIDE 25 MG PO TABS
25.0000 mg | ORAL_TABLET | Freq: Three times a day (TID) | ORAL | Status: DC
Start: 2013-10-20 — End: 2013-10-20
  Administered 2013-10-20 (×2): 25 mg via ORAL
  Filled 2013-10-20: qty 1

## 2013-10-20 MED ORDER — ONDANSETRON HCL 4 MG PO TABS: 4.0000 mg | ORAL_TABLET | Freq: Four times a day (QID) | ORAL | Status: DC | PRN

## 2013-10-20 MED ORDER — TAMSULOSIN HCL 0.4 MG PO CAPS
0.4000 mg | ORAL_CAPSULE | Freq: Every day | ORAL | Status: DC
  Administered 2013-10-21 – 2013-10-27 (×7): 0.4 mg via ORAL
  Filled 2013-10-20 (×8): qty 1

## 2013-10-20 MED ORDER — DOCUSATE SODIUM 100 MG PO CAPS
100.0000 mg | ORAL_CAPSULE | Freq: Two times a day (BID) | ORAL | Status: DC
  Administered 2013-10-20 – 2013-10-27 (×14): 100 mg via ORAL
  Filled 2013-10-20 (×17): qty 1

## 2013-10-20 MED ORDER — MORPHINE SULFATE ER 30 MG PO TBCR
30.0000 mg | EXTENDED_RELEASE_TABLET | Freq: Three times a day (TID) | ORAL | Status: DC
  Administered 2013-10-20 (×2): 30 mg via ORAL
  Filled 2013-10-20 (×2): qty 2

## 2013-10-20 MED ORDER — TRAMADOL HCL 50 MG PO TABS
100.0000 mg | ORAL_TABLET | Freq: Four times a day (QID) | ORAL | Status: DC
  Administered 2013-10-20 – 2013-10-25 (×18): 100 mg via ORAL
  Filled 2013-10-20 (×18): qty 2

## 2013-10-20 MED ORDER — WARFARIN SODIUM 5 MG PO TABS
5.0000 mg | ORAL_TABLET | Freq: Once | ORAL | Status: AC
  Administered 2013-10-20: 5 mg via ORAL
  Filled 2013-10-20 (×2): qty 1

## 2013-10-20 MED ORDER — TAMSULOSIN HCL 0.4 MG PO CAPS
0.4000 mg | ORAL_CAPSULE | Freq: Every day | ORAL | Status: DC
  Administered 2013-10-20: 0.4 mg via ORAL
  Filled 2013-10-20: qty 1

## 2013-10-20 MED ORDER — POLYETHYLENE GLYCOL 3350 17 G PO PACK
17.0000 g | PACK | Freq: Every day | ORAL | Status: DC
  Administered 2013-10-21 – 2013-10-26 (×6): 17 g via ORAL
  Filled 2013-10-20 (×8): qty 1

## 2013-10-20 MED ORDER — ALPRAZOLAM 0.25 MG PO TABS
0.5000 mg | ORAL_TABLET | Freq: Three times a day (TID) | ORAL | Status: DC | PRN
  Administered 2013-10-20 – 2013-10-25 (×8): 0.5 mg via ORAL
  Filled 2013-10-20 (×8): qty 2
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
## 2013-10-20 NOTE — Progress Notes (Signed)
Working with therapies, plan for CIR vs SNF. Doing better overall today. Patient examined and I agree with the assessment and plan  Violeta Gelinas, MD, MPH, FACS Pager: [phone]  10/20/2013 10:03 AM

## 2013-10-20 NOTE — Progress Notes (Signed)
UR completed. To CIR today.  Kylen Ismael, RN BSN M[facility] CCM Trauma/Neuro ICU Case Manager [phone]  
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
## 2013-10-20 NOTE — Progress Notes (Signed)
Patient ID: Amy Clark, female   DOB: July 11, 1991, 22 y.o.   MRN: [id number] Patient complained of increased left hip pain. 2 view radiographs of the left hip shows no evidence of any complicating fractures. Patient is felt to be a good candidate for inpatient rehabilitation. Will have the dressing changed on the right ankle so patient can work on range of motion and dorsiflexion of the ankle.
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
## 2013-10-20 NOTE — Progress Notes (Signed)
Rehab admissions - Evaluated for possible admission.  I spoke with patient and she would like inpatient rehab.  She hopes to go to mom's home after rehab at wheelchair level.  Bed available and can admit to acute inpatient rehab today.  Call me for questions.  #[phone]

## 2013-10-20 NOTE — Progress Notes (Signed)
Gave report to nurse in inpatient rehab.  Transferred pt. And all of pt's belongings to rehab.  Removed pt. IV. Amy Clark

## 2013-10-20 NOTE — Discharge Summary (Signed)
Blaise Grieshaber, MD, MPH, FACS Pager: [phone]  

## 2013-10-20 NOTE — Progress Notes (Signed)
Received pt. As a transfer from [address] [place].Pt. Is alert and oriented.Pt. Has dressing on both ankles and lt. Femur.Pt. Has some bruises on her arms.Skin is dry and intact otherwise Pt. Is from home with family.Keep monitoring pt. And assessing pt. Needs.

## 2013-10-21 ENCOUNTER — Inpatient Hospital Stay (HOSPITAL_COMMUNITY): Payer: No Typology Code available for payment source | Admitting: *Deleted

## 2013-10-21 ENCOUNTER — Encounter (HOSPITAL_COMMUNITY): Payer: Self-pay | Admitting: Rehabilitation

## 2013-10-21 ENCOUNTER — Inpatient Hospital Stay (HOSPITAL_COMMUNITY): Payer: No Typology Code available for payment source

## 2013-10-21 ENCOUNTER — Inpatient Hospital Stay (HOSPITAL_COMMUNITY): Payer: No Typology Code available for payment source | Admitting: Physical Therapy

## 2013-10-21 ENCOUNTER — Inpatient Hospital Stay (HOSPITAL_COMMUNITY): Payer: Self-pay | Admitting: Occupational Therapy

## 2013-10-21 DIAGNOSIS — S82843A Displaced bimalleolar fracture of unspecified lower leg, initial encounter for closed fracture: Secondary | ICD-10-CM

## 2013-10-21 DIAGNOSIS — M7989 Other specified soft tissue disorders: Secondary | ICD-10-CM

## 2013-10-21 DIAGNOSIS — S72309A Unspecified fracture of shaft of unspecified femur, initial encounter for closed fracture: Secondary | ICD-10-CM

## 2013-10-21 DIAGNOSIS — T07XXXA Unspecified multiple injuries, initial encounter: Secondary | ICD-10-CM

## 2013-10-21 LAB — CBC WITH DIFFERENTIAL/PLATELET
Basophils Absolute: 0.1 10*3/uL (ref 0.0–0.1)
Basophils Relative: 1 % (ref 0–1)
Eosinophils Absolute: 0.2 10*3/uL (ref 0.0–0.7)
Eosinophils Relative: 2 % (ref 0–5)
HCT: 28.3 % — ABNORMAL LOW (ref 36.0–46.0)
Hemoglobin: 9.6 g/dL — ABNORMAL LOW (ref 12.0–15.0)
MCH: 29.7 pg (ref 26.0–34.0)
MCHC: 33.9 g/dL (ref 30.0–36.0)
Monocytes Absolute: 1.1 10*3/uL — ABNORMAL HIGH (ref 0.1–1.0)
Monocytes Relative: 10 % (ref 3–12)
Platelets: 279 10*3/uL (ref 150–400)
RBC: 3.23 MIL/uL — ABNORMAL LOW (ref 3.87–5.11)
RDW: 13.1 % (ref 11.5–15.5)

## 2013-10-21 LAB — COMPREHENSIVE METABOLIC PANEL
AST: 41 U/L — ABNORMAL HIGH (ref 0–37)
Albumin: 2.9 g/dL — ABNORMAL LOW (ref 3.5–5.2)
BUN: 11 mg/dL (ref 6–23)
Calcium: 8.8 mg/dL (ref 8.4–10.5)
Creatinine, Ser: 0.81 mg/dL (ref 0.50–1.10)
GFR calc Af Amer: >90 mL/min (ref >90)
Total Bilirubin: 0.9 mg/dL (ref 0.3–1.2)
Total Protein: 6.2 g/dL (ref 6.0–8.3)

## 2013-10-21 LAB — PROTIME-INR
INR: 1.7 — ABNORMAL HIGH (ref 0.00–1.49)
Prothrombin Time: 19.5 seconds — ABNORMAL HIGH (ref 11.6–15.2)

## 2013-10-21 MED ORDER — WARFARIN SODIUM 7.5 MG PO TABS
7.5000 mg | ORAL_TABLET | Freq: Once | ORAL | Status: AC
  Administered 2013-10-21: 7.5 mg via ORAL
  Filled 2013-10-21: qty 1

## 2013-10-21 MED ORDER — BISACODYL 10 MG RE SUPP: 10.0000 mg | Freq: Every day | RECTAL | Status: DC | PRN

## 2013-10-21 NOTE — Progress Notes (Signed)
Patient information reviewed and entered into eRehab system by Welton Bord, RN, CRRN, PPS Coordinator.  Information including medical coding and functional independence measure will be reviewed and updated through discharge.    
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
## 2013-10-21 NOTE — Progress Notes (Signed)
RN was call to the room,pt. Complained about excruciate pain on her left ankle,as per pt. She did an un voluntary movement when she was sleeping and she hear a pop on her lt. Ankle.Pam PA was page,orders were received for 2 view x ray to the left ankle and bed rest until the results from the xray are obtained.Keep monitoring pt. Closely and assessing her needs.

## 2013-10-21 NOTE — Progress Notes (Signed)
*  Preliminary Results* Bilateral lower extremity venous duplex completed. Bilateral lower extremities are negative for deep vein thrombosis. There is no evidence of Baker's cyst bilaterally.  Preliminary results discussed with Doree Fudge, RN.  10/21/2013  Gertie Fey, RVT, RDCS, RDMS
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
## 2013-10-21 NOTE — Progress Notes (Signed)
X rays without displacement.  Will order Cam walker to be worn at all times to help stabilize ankle and decrease chances of displacement.

## 2013-10-21 NOTE — Progress Notes (Signed)
Physical Therapy Note  Patient Details  Name: Amy Clark MRN: [id number] Date of Birth: [birth year] Today's Date: 10/21/2013  Time: 1300-1330 30 minutes  1:1 No c/o pain, c/o "soreness" in arms.  Bed mobility with use of LE strap with supervision.  A/P transfers w/c <> bed with assist for set up and parts management, supervision for transfer.  Ramp training in w/c, pt able to perform with min A.  W/c push ups x 10, educated pt on importance of pressure relief due to increased time sitting in chair, she expresses understanding.  Pt able to propel w/c 200' before fatigue.   Tyler Cubit 10/21/2013, 2:04 PM
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
## 2013-10-21 NOTE — Progress Notes (Signed)
Physical Therapy Session Note  Patient Details  Name: Amy Clark MRN: [id number] Date of Birth: Jan 26, 1991  Today's Date: 10/21/2013 Time: 1435-1500 Time Calculation (min): 25 min  Short Term Goals: Week 1:  PT Short Term Goal 1 (Week 1): STGs=LTGs secondary to LOS  Skilled Therapeutic Interventions/Progress Updates:  1:1. Pt received semi-reclined in bed, alert but very fatigued and did not feel as if she could get OOB. RN in to provide pain medicine. Attempted introduction of slide board vs. AP transfers to pt, however, pt became very defensive and stated that she did not want to try it due to the fear of pain when B LE placed in gravity dependent position. Therapist discussing with pt and boyfriend regarding benefits of SBT for accessibility to car, toilet and furniture at home as all surfaces are not accessible via AP transfer. Pt verbalizing that her and her boyfriend had already discussed her to perform AP transfer into back seat of his car. Therapist providing further education that AP t/f into car may not be realistic or safe due to gap between frame of car and start of car seat, height of car seat as well as length/depth of car seat. Encouraged boyfriend to obtain measurements of the specified distances for trial in controlled environment first- measurements to be placed on board on pt's bathroom door. Pt semi-reclined in bed at end of session w/ all needs in reach.   Therapy Documentation Precautions:  Precautions Precautions: Fall Restrictions Weight Bearing Restrictions: Yes RLE Weight Bearing: Non weight bearing LLE Weight Bearing: Non weight bearing Other Position/Activity Restrictions: Per MD DUDA NOTE today: essentially nonweightbearing for 3 weeks with transfers only, anticipate full ambulation at 6 weeks.  See FIM for current functional status  Therapy/Group: Individual Therapy  Denzil Hughes 10/21/2013, 3:30 PM

## 2013-10-21 NOTE — Progress Notes (Signed)
Patient stated she feels much better compared yesterday. As per Delle Reining, PA patient will be using immobilizer at all times. Will continue to monitor closely.

## 2013-10-22 ENCOUNTER — Inpatient Hospital Stay (HOSPITAL_COMMUNITY): Payer: No Typology Code available for payment source

## 2013-10-22 LAB — PROTIME-INR
INR: 1.79 — ABNORMAL HIGH (ref 0.00–1.49)
Prothrombin Time: 20.3 seconds — ABNORMAL HIGH (ref 11.6–15.2)

## 2013-10-22 MED ORDER — WARFARIN SODIUM 7.5 MG PO TABS
7.5000 mg | ORAL_TABLET | Freq: Once | ORAL | Status: AC
  Administered 2013-10-22: 7.5 mg via ORAL
  Filled 2013-10-22: qty 1

## 2013-10-22 MED ORDER — MAGNESIUM CITRATE PO SOLN
1.0000 | Freq: Once | ORAL | Status: AC
  Administered 2013-10-22: 1 via ORAL
  Filled 2013-10-22: qty 296
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
## 2013-10-22 NOTE — Plan of Care (Signed)
Problem: RH BLADDER ELIMINATION Goal: RH STG MANAGE BLADDER WITH MEDICATION WITH ASSISTANCE STG Manage Bladder With Medication With modified independence Assistance.  Outcome: Progressing LBM 10/13/2013. Laxative given.

## 2013-10-23 ENCOUNTER — Inpatient Hospital Stay (HOSPITAL_COMMUNITY): Payer: No Typology Code available for payment source | Admitting: *Deleted

## 2013-10-23 ENCOUNTER — Inpatient Hospital Stay (HOSPITAL_COMMUNITY): Payer: No Typology Code available for payment source

## 2013-10-23 ENCOUNTER — Encounter (HOSPITAL_COMMUNITY): Payer: Self-pay | Admitting: Occupational Therapy

## 2013-10-23 ENCOUNTER — Inpatient Hospital Stay (HOSPITAL_COMMUNITY): Payer: No Typology Code available for payment source | Admitting: Physical Therapy

## 2013-10-23 LAB — PROTIME-INR
INR: 1.91 — ABNORMAL HIGH (ref 0.00–1.49)
Prothrombin Time: 21.3 seconds — ABNORMAL HIGH (ref 11.6–15.2)

## 2013-10-23 MED ORDER — WARFARIN SODIUM 7.5 MG PO TABS
7.5000 mg | ORAL_TABLET | Freq: Once | ORAL | Status: AC
  Administered 2013-10-23: 7.5 mg via ORAL
  Filled 2013-10-23: qty 1

## 2013-10-23 NOTE — Progress Notes (Signed)
Physical Therapy Session Note  Patient Details  Name: Amy Clark MRN: [id number] Date of Birth: [birth year]  Today's Date: 10/23/2013 Time: 4098-1191 Time Calculation (min): 30 min  Short Term Goals: Week 1:  PT Short Term Goal 1 (Week 1): STGs=LTGs secondary to LOS  Skilled Therapeutic Interventions/Progress Updates:  1:1. Pt received semi-reclined in bed, alert and ready for therapy. Pt w/ complaints of plantar flexed positioning of L ankle in CAM boot, therapist assist for repositioning and education regarding importance of proper positioning in boot for healing. Focus this session on bed mobility w/ use of leg lifter as well as level and unlevel SBT to mimic height difference between w/c and car seat that pt anticipates to d/c home in. Continued discussion regarding increased safety and positioning in car w/ use of SBT vs. AP t/f, pt verbalized understanding. Pt req overall (S) for bed mobility w/ use of leg lifter and min cues for improved seq and technique. Pt able to perform level SBT w/ (S), however, req min guard to min A during unlevel SBT w/ increased cueing of seq and technique. Pt semi-reclined in bed at end of session w/ all needs in reach.   Therapy Documentation Precautions:  Precautions Precautions: Fall Restrictions Weight Bearing Restrictions: Yes RLE Weight Bearing: Non weight bearing LLE Weight Bearing: Non weight bearing Other Position/Activity Restrictions: Per MD DUDA NOTE today: essentially nonweightbearing for 3 weeks with transfers only, anticipate full ambulation at 6 weeks.  See FIM for current functional status  Therapy/Group: Individual Therapy  Denzil Hughes 10/23/2013, 3:06 PM
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
## 2013-10-23 NOTE — Progress Notes (Signed)
Physical Therapy Note  Patient Details  Name: Amy Clark MRN: [id number] Date of Birth: 08-02-1991 Today's Date: 10/23/2013  Time: 1300-1345 45 minutes  1:1 No c/o pain.  A/P transfers bed <> w/c and bed <> BSC with supervision/set up assist.  Pt able to perform clothing management and hygiene for toileting at mod I level.  Bed mobility mod I.  Car transfer performed with sliding board to enter car, posterior transfer to exit.  Pt able to perform with min A for L LE management.  Discussed with pt importance of practicing car transfer to the car she will be traveling home in and the importance of family education for car transfer.  She expresses understanding but states she does not know when her family/boyfriend will be able to come in.   Sherree Shankman 10/23/2013, 1:41 PM

## 2013-10-23 NOTE — Progress Notes (Signed)
Occupational Therapy Session Note  Patient Details  Name: Amy Clark MRN: [id number] Date of Birth: 1991/01/25  Today's Date: 10/23/2013 Time: 0830-0930 Time Calculation (min): 60 min  Short Term Goals: Week 1:  OT Short Term Goal 1 (Week 1): STG=LTG due to short LOS  Skilled Therapeutic Interventions/Progress Updates:    1:1 Self care retraining including bathing and dressing at bed level. Pt utilized reacher and leg lifter for left LE management during functional tasks. Pt requires rest breaks throughout session due to pain and decr activity tolerance but is able to encourage herself and continue.   Therapy Documentation Precautions:  Precautions Precautions: Fall Restrictions Weight Bearing Restrictions: Yes RLE Weight Bearing: Non weight bearing LLE Weight Bearing: Non weight bearing Other Position/Activity Restrictions: Per MD DUDA NOTE today: essentially nonweightbearing for 3 weeks with transfers only, anticipate full ambulation at 6 weeks. Pain: No c/o pain in session; reports soreness but able to continue with therapy. See FIM for current functional status  Therapy/Group: Individual Therapy  Roney Mans [facility] 10/23/2013, 9:22 AM

## 2013-10-23 NOTE — Progress Notes (Signed)
Physical Therapy Session Note  Patient Details  Name: Amy Clark MRN: [id number] Date of Birth: Feb 16, 1991  Today's Date: 10/23/2013 Time: 1015-1100 Time Calculation (min): 45 min  Short Term Goals: Week 1:  PT Short Term Goal 1 (Week 1): STGs=LTGs secondary to LOS  Skilled Therapeutic Interventions/Progress Updates:    Patient received seated in wheelchair. Session focused on functional transfers, wheelchair mobility, and discharge planning. Patient provided this therapist with car seat dimensions as asked of her in previous sessions. Discussion about how anterior/posterior transfers to/from car will be very difficult and potentially unsafe secondary to gap between car and wheelchair and the depth of car seats. Introduced idea of using slide board and patient is resistant, but willing to attempt during today's session. Discussion about how actual car transfer could be attempted whenever her boyfriend/family is present and could be scheduled during therapy session. Patient states she will try to find out when boyfriend/family will be here tomorrow. Additionally, discussed with patient that slide board transfer will likely decrease R shoulder irritation.  Patient performed wheelchair mobility x160' x2 with supervision. Patient educated with visual/verbal demo for slideboard transfer. Patient able to manage all wheelchair parts without cues or assist. Patient performed slideboard transfer wheelchair<>mat with supervision for verbal cues for proper sequencing, technique, and set up. Patient states that transfer was easier than anticipated. Patient performed wheelchair mobility in ADL apartment, in/out of bathroom, around obstacles, practicing turning and backing up in small spaces, performed with supervision. Educated patient on pressure relief strategies when sitting in wheelchair and R shoulder stretches (cross body/adduction stretch and IR/ER towel stretch). Patient performed wheelchair>bed  transfer via anterior-posterior technique with supervision, returned to supine with supervision. Patient left supine in bed with all needs within reach.  Therapy Documentation Precautions:  Precautions Precautions: Fall Restrictions Weight Bearing Restrictions: Yes RLE Weight Bearing: Non weight bearing LLE Weight Bearing: Non weight bearing Other Position/Activity Restrictions: Per MD DUDA NOTE today: essentially nonweightbearing for 3 weeks with transfers only, anticipate full ambulation at 6 weeks. Pain: Pain Assessment Pain Assessment: 0-10 Pain Score: 5  Pain Type: Surgical pain Pain Location: Leg Pain Orientation: Left Pain Descriptors / Indicators: Aching Pain Onset: On-going Locomotion : Ambulation Ambulation/Gait Assistance: Not tested (comment) Wheelchair Mobility Distance: 160   See FIM for current functional status  Therapy/Group: Individual Therapy  Chipper Herb. Reda Citron, PT, DPT 10/23/2013, 12:18 PM

## 2013-10-24 ENCOUNTER — Inpatient Hospital Stay (HOSPITAL_COMMUNITY): Payer: No Typology Code available for payment source | Admitting: Physical Therapy

## 2013-10-24 ENCOUNTER — Inpatient Hospital Stay (HOSPITAL_COMMUNITY): Payer: No Typology Code available for payment source

## 2013-10-24 ENCOUNTER — Inpatient Hospital Stay (HOSPITAL_COMMUNITY): Payer: No Typology Code available for payment source | Admitting: Occupational Therapy

## 2013-10-24 ENCOUNTER — Inpatient Hospital Stay (HOSPITAL_COMMUNITY): Payer: Self-pay | Admitting: Occupational Therapy

## 2013-10-24 LAB — PROTIME-INR: Prothrombin Time: 22.2 seconds — ABNORMAL HIGH (ref 11.6–15.2)

## 2013-10-24 MED ORDER — WARFARIN SODIUM 7.5 MG PO TABS
7.5000 mg | ORAL_TABLET | Freq: Every day | ORAL | Status: AC
  Administered 2013-10-24: 7.5 mg via ORAL
  Filled 2013-10-24: qty 1
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
## 2013-10-24 NOTE — Progress Notes (Signed)
Occupational Therapy Session Note  Patient Details  Name: Amy Clark MRN: [id number] Date of Birth: 1991-08-17  Today's Date: 10/24/2013 Time: 1020-1125 and 2:40- 3:25 Time Calculation (min): 65 min and 45 min  Short Term Goals: Week 1:  OT Short Term Goal 1 (Week 1): STG=LTG due to short LOS  Skilled Therapeutic Interventions/Progress Updates:    Visit 1:  Pain: Pt had no complaints of pain, but was c/o itching.  Treatment:  Pt seen for B/D at bed level for LB and from chair level for UB self care.  She is now able to sit and reach to her toes in long sitting on the bed, so she was able to reach to her shins to wash and pull loose fitting shorts over B feet.  She was able to pull pants over hips.  Pt completed posterior transfer to w/c with chair being stabilized to complete UB self care at the sink.  Pt did well using leg lifter to control her L leg and managing to place B leg rests on chair. Pt then transferred back to bed using anterior transfer with assist to steady w/c. Pt resting in bed at end of session with call light in reach.  Visit 2: Pain: no c/o pain Treatment:Pt was in w/c at start of session and propelled self to gym to use UBE for 15 min. 5 min on with 1-2 min rest, 3x at resistance 5.  She propelled back to room to use BSC. Pt stated her arms were very fatigued but she was able to complete W/c to bed to [facility] back to bed all with supervision and support on w/c/ bsc.  Pt was able to manage clothing and cleanup with supervision . Pt resting in bed at end of session with call light in reach.    Therapy Documentation Precautions:  Precautions Precautions: Fall Restrictions Weight Bearing Restrictions: Yes RLE Weight Bearing: Non weight bearing LLE Weight Bearing: Non weight bearing Other Position/Activity Restrictions: Per MD DUDA NOTE today: essentially nonweightbearing for 3 weeks with transfers only, anticipate full ambulation at 6 weeks.   ADL:  See FIM for  current functional status  Therapy/Group: Individual Therapy  SAGUIER,JULIA 10/24/2013, 12:09 PM

## 2013-10-24 NOTE — Progress Notes (Signed)
[facility] Individual Statement of Services  Patient Name:  Nannie Starzyk  Date:  10/21/2013  Welcome to the [facility].  Our goal is to provide you with an individualized program based on your diagnosis and situation, designed to meet your specific needs.  With this comprehensive rehabilitation program, you will be expected to participate in at least 3 hours of rehabilitation therapies Monday-Friday, with modified therapy programming on the weekends.  Your rehabilitation program will include the following services:  Physical Therapy (PT), Occupational Therapy (OT), 24 hour per day rehabilitation nursing, Therapeutic Recreaction (TR), Neuropsychology, Case Management (Social Worker), Rehabilitation Medicine, Nutrition Services and Pharmacy Services  Weekly team conferences will be held on Tuesdays to discuss your progress.  Your Social Worker will talk with you frequently to get your input and to update you on team discussions.  Team conferences with you and your family in attendance may also be held.  Expected length of stay: 7-9 days  Overall anticipated outcome: independent w/c level  Depending on your progress and recovery, your program may change. Your Social Worker will coordinate services and will keep you informed of any changes. Your Social Worker's name and contact numbers are listed  below.  The following services may also be recommended but are not provided by the [facility]:   Driving Evaluations  Home Health Rehabiltiation Services  Outpatient Rehabilitation Services  Vocational Rehabilitation   Arrangements will be made to provide these services after discharge if needed.  Arrangements include referral to agencies that provide these services.  Your insurance has been verified to be:  None Your primary doctor is:  None  Pertinent information will be shared with your doctor and your insurance  company.  Social Worker:  [place], [place] [phone] or ([phone]   Information discussed with and copy given to patient by: Amada Jupiter, 10/21/2013, 3:57 PM
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
## 2013-10-24 NOTE — Progress Notes (Signed)
Physical Therapy Session Note  Patient Details  Name: Amy Clark MRN: [id number] Date of Birth: Feb 07, 1991  Today's Date: 10/24/2013 Time:1300-1400, 60 min  -    Short Term Goals: Week 1:  PT Short Term Goal 1 (Week 1): STGs=LTGs secondary to LOS     Skilled Therapeutic Interventions/Progress Updates:   Pt performed posterior scoot transfer bed> w/c, using bil shoulders for lifting body to clear bed.    Pt propelled w/c x 150' to/from room to gym,  modified independent:   LE ex group  Therapeutic exercise performed with bil LE to increase strength for functional mobility: L knee active knee flexion, R and L long arc quad extensions, (gradually increasing excursion LLE) , R and L seated SLR, resisted bil hip abduction against green Theraband, resisted R PF against green Theraband; trunk rotation with bil knees flexed, holding 5# bar weight overhead     Therapy Documentation Precautions:  Precautions Precautions: Fall Restrictions Weight Bearing Restrictions: Yes RLE Weight Bearing: Non weight bearing LLE Weight Bearing: Non weight bearing Other Position/Activity Restrictions: Per MD DUDA NOTE today: essentially nonweightbearing for 3 weeks with transfers only, anticipate full ambulation at 6 weeks.   Pain: Pain Assessment Pain Assessment: 0-10 Pain Score:5 Pain Type: Surgical pain Pain Location: Leg Pain Orientation: Left Pain Descriptors / Indicators: Aching Patients Stated Pain Goal: 2 Pain Intervention(s): Medication (See eMAR)   See FIM for current functional status  Therapy/Group: Group Therapy  Amy Clark 10/24/2013, 8:28 AM

## 2013-10-25 LAB — PROTIME-INR: INR: 2.16 — ABNORMAL HIGH (ref 0.00–1.49)

## 2013-10-25 MED ORDER — TRAMADOL HCL 50 MG PO TABS
50.0000 mg | ORAL_TABLET | Freq: Four times a day (QID) | ORAL | Status: DC
  Administered 2013-10-25 – 2013-10-27 (×8): 50 mg via ORAL
  Filled 2013-10-25 (×8): qty 1

## 2013-10-25 MED ORDER — WARFARIN SODIUM 5 MG PO TABS
5.0000 mg | ORAL_TABLET | ORAL | Status: DC
  Filled 2013-10-25: qty 1

## 2013-10-25 MED ORDER — WARFARIN SODIUM 7.5 MG PO TABS
7.5000 mg | ORAL_TABLET | ORAL | Status: DC
  Administered 2013-10-25: 7.5 mg via ORAL
  Filled 2013-10-25: qty 1
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
## 2013-10-26 ENCOUNTER — Inpatient Hospital Stay (HOSPITAL_COMMUNITY): Payer: Self-pay | Admitting: Physical Therapy

## 2013-10-26 ENCOUNTER — Encounter (HOSPITAL_COMMUNITY): Payer: Self-pay | Admitting: Occupational Therapy

## 2013-10-26 ENCOUNTER — Inpatient Hospital Stay (HOSPITAL_COMMUNITY): Payer: Self-pay | Admitting: Occupational Therapy

## 2013-10-26 ENCOUNTER — Inpatient Hospital Stay (HOSPITAL_COMMUNITY): Payer: Self-pay | Admitting: *Deleted

## 2013-10-26 DIAGNOSIS — S82843A Displaced bimalleolar fracture of unspecified lower leg, initial encounter for closed fracture: Secondary | ICD-10-CM

## 2013-10-26 DIAGNOSIS — S72309A Unspecified fracture of shaft of unspecified femur, initial encounter for closed fracture: Secondary | ICD-10-CM

## 2013-10-26 MED ORDER — WARFARIN SODIUM 7.5 MG PO TABS
7.5000 mg | ORAL_TABLET | Freq: Once | ORAL | Status: AC
  Administered 2013-10-26: 7.5 mg via ORAL
  Filled 2013-10-26: qty 1

## 2013-10-26 MED ORDER — METHOCARBAMOL 500 MG PO TABS: 500.0000 mg | ORAL_TABLET | Freq: Four times a day (QID) | ORAL | Status: DC | PRN

## 2013-10-26 NOTE — Discharge Summary (Signed)
Discharge summary job # [id number]

## 2013-10-26 NOTE — Plan of Care (Signed)
Problem: RH PAIN MANAGEMENT Goal: RH STG PAIN MANAGED AT OR BELOW PT'S PAIN GOAL <8
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
## 2013-10-26 NOTE — Plan of Care (Signed)
Problem: RH PAIN MANAGEMENT Goal: RH STG PAIN MANAGED AT OR BELOW PT'S PAIN GOAL Outcome: Not Progressing Pt reports pain as 7.5
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
## 2013-10-26 NOTE — Progress Notes (Signed)
Physical Therapy Discharge Summary  Patient Details  Name: Amy Clark MRN: [id number] Date of Birth: August 24, 1991  Today's Date: 10/26/2013 Time: 4098-1191 Time Calculation (min): 57 min  Patient has met 8 of 8 long term goals due to improved activity tolerance, improved balance, improved postural control, increased strength, increased range of motion, decreased pain, ability to compensate for deficits, functional use of  right upper extremity and left upper extremity and improved coordination.  Patient to discharge at a wheelchair level Modified Independent.   Patient's care partner is independent to provide the necessary set up assistance for car transfers at discharge.  Reasons goals not met: N/A, all LTGs met  Recommendation:  Patient will benefit from ongoing skilled PT services in home health setting to continue to advance safe functional mobility, address ongoing impairments in strength, range of motion, activity tolerance, overall functional mobility, and minimize fall risk.  Equipment: wheelchair, basic cushion, slideboard, RW (for use once patient is ambulatory)  Reasons for discharge: treatment goals met and discharge from hospital  Patient/family agrees with progress made and goals achieved: Yes  Skilled Interventions: Patient received sitting in wheelchair, step dad present. Session focused on all functional mobility and family education in preparation for discharge home. Patient to discharge at [facility] I wheelchair level for bed mobility, transfers (both anterior-posterior and laterals with slide board). Patient is independent with management of all wheelchair parts and set-up, including negotiation of 3% grade ramp. Patient's step-dad observed patient perform bed mobility, anterior-posterior transfer (wheelchair>bed), lateral transfer with slide board (wheelchair<>bed), and management of all wheelchair parts/set up. Discussed patient's home exercise program with patient and step  dad, provided with 2 handouts and therabands for home exercise program. Patient and step dad verbalized understanding with all mobility and safety and have no further questions at this time. Patient left supine in bed with all needs within reach and step dad present.  PT Discharge Precautions/Restrictions Precautions Precautions: Fall Required Braces or Orthoses: Other Brace/Splint Other Brace/Splint: CAM boot on left foot Restrictions Weight Bearing Restrictions: Yes RLE Weight Bearing: Non weight bearing LLE Weight Bearing: Non weight bearing Other Position/Activity Restrictions: Per MD DUDA NOTE today: essentially nonweightbearing for 3 weeks with transfers only, anticipate full ambulation at 6 weeks. Pain Pain Assessment Pain Assessment: 0-10 Pain Score: 7  Pain Type: Surgical pain Pain Location: Leg Pain Orientation: Left Pain Descriptors / Indicators: Sore;Aching Pain Onset: On-going Pain Intervention(s): Repositioned;RN made aware Vision/Perception  Vision - History Baseline Vision: No visual deficits Patient Visual Report: No change from baseline Vision - Assessment Eye Alignment: Within Functional Limits Perception Perception: Within Functional Limits Praxis Praxis: Intact  Cognition Overall Cognitive Status: Within Functional Limits for tasks assessed Arousal/Alertness: Awake/alert Orientation Level: Oriented X4 Memory: Appears intact Awareness: Appears intact Safety/Judgment: Appears intact Sensation Sensation Light Touch: Appears Intact Proprioception: Appears Intact Coordination Gross Motor Movements are Fluid and Coordinated: Yes Fine Motor Movements are Fluid and Coordinated: Yes Motor  Motor Motor: Within Functional Limits Motor - Discharge Observations: improved overall strength and activity tolerance  Mobility Bed Mobility Bed Mobility: Supine to Sit;Sit to Supine Rolling Right: 6: Modified independent (Device/Increase time) Rolling Left: 6:  Modified independent (Device/Increase time) Supine to Sit: 6: Modified independent (Device/Increase time);HOB flat Sitting - Scoot to Edge of Bed: 6: Modified independent (Device/Increase time) Sit to Supine: 6: Modified independent (Device/Increase time);HOB flat Transfers Transfers: Yes Anterior-Posterior Transfer: 6: Modified independent (Device/Increase time);To level surface;To elevated surface Lateral/Scoot Transfers: 6: Modified independent (Device/Increase time);With armrests removed;With slide board Locomotion  Ambulation Ambulation: No Ambulation/Gait Assistance: Not tested (comment) Gait Gait: No Stairs / Additional Locomotion Stairs: No Ramp: 6: Modified independent (Device) (seated in wheelchair) Wheelchair Mobility Wheelchair Mobility: Yes Wheelchair Assistance: 6: Modified independent (Device/Increase time) Occupational hygienist: Both upper extremities Wheelchair Parts Management: Independent Distance: 160  Trunk/Postural Assessment  Cervical Assessment Cervical Assessment: Within Functional Limits Thoracic Assessment Thoracic Assessment: Within Functional Limits Lumbar Assessment Lumbar Assessment: Within Functional Limits Postural Control Postural Control: Within Functional Limits  Balance Balance Balance Assessed: Yes Static Sitting Balance Static Sitting - Balance Support: No upper extremity supported;Feet unsupported Static Sitting - Level of Assistance: 6: Modified independent (Device/Increase time) Dynamic Sitting Balance Dynamic Sitting - Balance Support: No upper extremity supported;Feet unsupported;During functional activity (management of wheelchair parts) Dynamic Sitting - Level of Assistance: 6: Modified independent (Device/Increase time) Extremity Assessment  RUE Assessment RUE Assessment: Within Functional Limits LUE Assessment LUE Assessment: Within Functional Limits RLE Assessment RLE Assessment: Exceptions to [facility] RLE Strength RLE  Overall Strength: Deficits;Due to pain RLE Overall Strength Comments: Grossly 3/5, no MMT performed secondary to pain. LLE Assessment LLE Assessment: Exceptions to [facility] LLE Strength LLE Overall Strength: Deficits;Due to pain LLE Overall Strength Comments: 3/5 ankle DF/PF, unable to move leg against gravity in any other direction  See FIM for current functional status  Damone Fancher S Verbena Boeding S. Jadia Capers, PT, DPT 10/26/2013, 3:48 PM

## 2013-10-26 NOTE — Progress Notes (Signed)
Physical Therapy Session Note  Patient Details  Name: Legna Mausolf MRN: [id number] Date of Birth: [birth year]  Today's Date: 10/26/2013 Time: 0930-1025 Time Calculation (min): 55 min  Short Term Goals: Week 1:  PT Short Term Goal 1 (Week 1): STGs=LTGs secondary to LOS  Skilled Therapeutic Interventions/Progress Updates:    Community wheelchair mobility >500-600' total propelling over uneven paved and brick terrain, ascending/descending inclines, negotiating tight spaces outdoors and through gift shop at modified independent level. Anterior/posterior transfers wheelchair to/from bed at modified independent level. Discussed community situations (ease of going out to eat, etc) and importance of pressure relief when up in wheelchair. Pt excited to go home.   Therapy Documentation Precautions:  Precautions Precautions: Fall Restrictions Weight Bearing Restrictions: Yes RLE Weight Bearing: Non weight bearing (L femur with IM nailing) LLE Weight Bearing: Non weight bearing (R ankle ORIF) Other Position/Activity Restrictions: Per MD DUDA NOTE today: essentially nonweightbearing for 3 weeks with transfers only, anticipate full ambulation at 6 weeks. Pain: Pain Assessment Pain Score: 3  Pain Type: Surgical pain Pain Location: Leg Pain Orientation: Left Pain Intervention(s): Repositioned  See FIM for current functional status  Therapy/Group: Individual Therapy  Wilhemina Bonito 10/26/2013, 12:10 PM

## 2013-10-26 NOTE — Progress Notes (Signed)
Occupational Therapy Discharge Summary  Patient Details  Name: Amy Clark MRN: [id number] Date of Birth: [birth year]  Today's Date: 10/26/2013 Time: 1105-1200 No c/o pain - did report mm spasms- pt reported had already told RN.  GRAD DAY! 1:1 self care retraining at bed level with focus on performing all aspects with only setup. Transfers bed to recliner with slide board mod I, and slide board transfer recliner to w/c mod I. Pt still reports not liking the slide board but does agree it is an option for home. Continued to discuss d/c planning (home to mother's home.) Problem solving in the ADL kitchen simple meal prep items and practicing accessing different appliances and cabinets at home.  Performed obstacle course at w/c level with education provided about manuevering w/c in small spaces and in a home environment.   2nd session 14:00-14:30  No c/o pain  1:1 Family education with pt and pt's stepfather. Education on pt issued w/c, as well as adjustment of bilateral Leg rest, anterior/ posterior transfers, setup for self care tasks, w/c mobility,  education on CAM boot and care for her skin, d/c planning.    Patient has met 6 of 6 long term goals due to improved activity tolerance, postural control, ability to compensate for deficits and functional use of  RIGHT upper and LEFT upper extremity.  Patient to discharge at overall supervision to mod I at w/c level level.  Patient's care partner is independent to provide the necessary physical assistance at discharge.    Reasons goals not met: n/a  Recommendation:  No OT f/u at this time  Equipment: long slide board, w/c with cushion with elevating leg rests, drop arm commode   Reasons for discharge: treatment goals met and discharge from hospital  Patient/family agrees with progress made and goals achieved: Yes  OT Discharge Precautions/Restrictions  Precautions Precautions: Fall Required Braces or Orthoses: Other  Brace/Splint Other Brace/Splint: CAM boot on left foot Restrictions RLE Weight Bearing: Non weight bearing LLE Weight Bearing: Non weight bearing Other Position/Activity Restrictions: Per MD DUDA NOTE today: essentially nonweightbearing for 3 weeks with transfers only, anticipate full ambulation at 6 weeks. Pain  no c/o pain  ADL  see FIM Vision/Perception  Vision - History Baseline Vision: No visual deficits Patient Visual Report: No change from baseline Vision - Assessment Eye Alignment: Within Functional Limits Perception Perception: Within Functional Limits Praxis Praxis: Intact  Cognition Overall Cognitive Status: Within Functional Limits for tasks assessed Arousal/Alertness: Awake/alert Orientation Level: Oriented X4 Memory: Appears intact Awareness: Appears intact Safety/Judgment: Appears intact Sensation Sensation Light Touch: Appears Intact Proprioception: Appears Intact Coordination Gross Motor Movements are Fluid and Coordinated: Yes Fine Motor Movements are Fluid and Coordinated: Yes Motor  Motor Motor: Within Functional Limits Motor - Discharge Observations: improved overall strength and activity tolerance Mobility  Bed Mobility Bed Mobility: Rolling Right;Rolling Left Rolling Right: 6: Modified independent (Device/Increase time) Rolling Left: 6: Modified independent (Device/Increase time) Supine to Sit: 6: Modified independent (Device/Increase time) Sitting - Scoot to Edge of Bed: 6: Modified independent (Device/Increase time) Sit to Supine: 6: Modified independent (Device/Increase time)  Trunk/Postural Assessment  Cervical Assessment Cervical Assessment: Within Functional Limits Thoracic Assessment Thoracic Assessment: Within Functional Limits Lumbar Assessment Lumbar Assessment: Within Functional Limits Postural Control Postural Control: Within Functional Limits  Balance Static Sitting Balance Static Sitting - Level of Assistance: 6: Modified  independent (Device/Increase time) Extremity/Trunk Assessment RUE Assessment RUE Assessment: Within Functional Limits LUE Assessment LUE Assessment: Within Functional Limits  See FIM for current  functional status  Roney Mans [facility] 10/26/2013, 2:54 PM
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
## 2013-10-27 DIAGNOSIS — S72309A Unspecified fracture of shaft of unspecified femur, initial encounter for closed fracture: Secondary | ICD-10-CM

## 2013-10-27 DIAGNOSIS — S82843A Displaced bimalleolar fracture of unspecified lower leg, initial encounter for closed fracture: Secondary | ICD-10-CM

## 2013-10-27 LAB — PROTIME-INR
INR: 1.97 — ABNORMAL HIGH (ref 0.00–1.49)
Prothrombin Time: 21.8 seconds — ABNORMAL HIGH (ref 11.6–15.2)

## 2013-10-27 MED ORDER — METHOCARBAMOL 500 MG PO TABS: 500.0000 mg | ORAL_TABLET | Freq: Four times a day (QID) | ORAL | Status: DC | PRN

## 2013-10-27 MED ORDER — MORPHINE SULFATE ER 30 MG PO TBCR: 30.0000 mg | EXTENDED_RELEASE_TABLET | Freq: Three times a day (TID) | ORAL | Status: DC

## 2013-10-27 MED ORDER — WARFARIN SODIUM 5 MG PO TABS: 5.0000 mg | ORAL_TABLET | Freq: Every day | ORAL | Status: DC

## 2013-10-27 MED ORDER — WARFARIN SODIUM 5 MG PO TABS: ORAL_TABLET | ORAL | Status: DC

## 2013-10-27 MED ORDER — OXYCODONE HCL 10 MG PO TABS: 10.0000 mg | ORAL_TABLET | ORAL | Status: DC | PRN

## 2013-10-27 MED ORDER — WARFARIN SODIUM 7.5 MG PO TABS
7.5000 mg | ORAL_TABLET | Freq: Once | ORAL | Status: AC
  Administered 2013-10-27: 7.5 mg via ORAL
  Filled 2013-10-27: qty 1

## 2013-10-27 MED ORDER — ALPRAZOLAM 0.5 MG PO TABS: 0.5000 mg | ORAL_TABLET | Freq: Three times a day (TID) | ORAL | Status: DC | PRN
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
## 2013-10-27 NOTE — Progress Notes (Signed)
Social Work Discharge Note  The overall goal for the admission was met for:   Discharge location: Yes - home with mother and step-father who can provide 24/7 assistance  Length of Stay: Yes - 7 days  Discharge activity level: Yes - mod independent w/c level  Home/community participation: Yes  Services provided included: MD, RD, PT, OT, RN, TR, Pharmacy and SW  Financial Services: Other: None - screening for Medicaid potential (financial counseling)  Follow-up services arranged: Home Health: RN, PT via [facility], DME: 20x18 lightweight w/c, cushion, rolling walker, semi-electric hospital bed, drop arm commode and 30"transfer board all via [facility], Other: enrolled pt in [facility] program for med assist x one month and Patient/Family has no preference for HH/DME agencies  Comments (or additional information):  Patient/Family verbalized understanding of follow-up arrangements: Yes  Individual responsible for coordination of the follow-up plan: patient  Confirmed correct DME delivered: Palmina Clodfelter 10/27/2013    Kace Hartje

## 2013-10-27 NOTE — Plan of Care (Signed)
Problem: RH PAIN MANAGEMENT Goal: RH STG PAIN MANAGED AT OR BELOW PT'S PAIN GOAL Outcome: Completed/Met Date Met:  10/27/13 Pain managed with scheduled medication

## 2013-10-27 NOTE — Progress Notes (Signed)
Pt discharged home with significant other. Discharge instructions provided by Harvel Ricks, PA. All questions answered, pt verbalized understanding. Pt escorted off unit in w/c with personal belonging by Rennis Harding,  NT.
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
## 2013-12-10 DIAGNOSIS — S92109A Unspecified fracture of unspecified talus, initial encounter for closed fracture: Secondary | ICD-10-CM

## 2013-12-10 DIAGNOSIS — S7290XA Unspecified fracture of unspecified femur, initial encounter for closed fracture: Secondary | ICD-10-CM

## 2013-12-10 DIAGNOSIS — IMO0001 Reserved for inherently not codable concepts without codable children: Secondary | ICD-10-CM

## 2013-12-10 DIAGNOSIS — S2691XA Contusion of heart, unspecified with or without hemopericardium, initial encounter: Secondary | ICD-10-CM

## 2014-01-07 ENCOUNTER — Encounter (HOSPITAL_COMMUNITY): Payer: Self-pay | Admitting: Emergency Medicine

## 2014-01-07 ENCOUNTER — Emergency Department (HOSPITAL_COMMUNITY)
Admission: EM | Admit: 2014-01-07 | Discharge: 2014-01-08 | Disposition: A | Discharge status: Home / Self Care | Payer: Medicaid Other | Attending: Emergency Medicine | Admitting: Emergency Medicine

## 2014-01-07 DIAGNOSIS — F17200 Nicotine dependence, unspecified, uncomplicated: Secondary | ICD-10-CM | POA: Insufficient documentation

## 2014-01-07 DIAGNOSIS — R05 Cough: Secondary | ICD-10-CM | POA: Insufficient documentation

## 2014-01-07 DIAGNOSIS — N390 Urinary tract infection, site not specified: Secondary | ICD-10-CM

## 2014-01-07 DIAGNOSIS — Z8659 Personal history of other mental and behavioral disorders: Secondary | ICD-10-CM | POA: Insufficient documentation

## 2014-01-07 DIAGNOSIS — R51 Headache: Secondary | ICD-10-CM | POA: Insufficient documentation

## 2014-01-07 DIAGNOSIS — Z79899 Other long term (current) drug therapy: Secondary | ICD-10-CM | POA: Insufficient documentation

## 2014-01-07 DIAGNOSIS — J029 Acute pharyngitis, unspecified: Secondary | ICD-10-CM | POA: Insufficient documentation

## 2014-01-07 DIAGNOSIS — IMO0001 Reserved for inherently not codable concepts without codable children: Secondary | ICD-10-CM | POA: Insufficient documentation

## 2014-01-07 DIAGNOSIS — Z3202 Encounter for pregnancy test, result negative: Secondary | ICD-10-CM | POA: Insufficient documentation

## 2014-01-07 DIAGNOSIS — R509 Fever, unspecified: Secondary | ICD-10-CM | POA: Insufficient documentation

## 2014-01-07 DIAGNOSIS — R630 Anorexia: Secondary | ICD-10-CM | POA: Insufficient documentation

## 2014-01-07 DIAGNOSIS — J3489 Other specified disorders of nose and nasal sinuses: Secondary | ICD-10-CM | POA: Insufficient documentation

## 2014-01-07 DIAGNOSIS — R059 Cough, unspecified: Secondary | ICD-10-CM | POA: Insufficient documentation

## 2014-01-07 DIAGNOSIS — R197 Diarrhea, unspecified: Secondary | ICD-10-CM | POA: Insufficient documentation

## 2014-01-07 DIAGNOSIS — Z8781 Personal history of (healed) traumatic fracture: Secondary | ICD-10-CM | POA: Insufficient documentation

## 2014-01-07 LAB — CBC WITH DIFFERENTIAL/PLATELET
BASOS ABS: 0 10*3/uL (ref 0.0–0.1)
BASOS PCT: 0 % (ref 0–1)
Eosinophils Absolute: 0 10*3/uL (ref 0.0–0.7)
Eosinophils Relative: 0 % (ref 0–5)
HEMATOCRIT: 45.1 % (ref 36.0–46.0)
Hemoglobin: 15.1 g/dL — ABNORMAL HIGH (ref 12.0–15.0)
Lymphocytes Relative: 5 % — ABNORMAL LOW (ref 12–46)
Lymphs Abs: 0.8 10*3/uL (ref 0.7–4.0)
MCH: 28 pg (ref 26.0–34.0)
MCHC: 33.5 g/dL (ref 30.0–36.0)
MCV: 83.7 fL (ref 78.0–100.0)
MONO ABS: 0.6 10*3/uL (ref 0.1–1.0)
Monocytes Relative: 4 % (ref 3–12)
Neutro Abs: 13.2 10*3/uL — ABNORMAL HIGH (ref 1.7–7.7)
Neutrophils Relative %: 90 % — ABNORMAL HIGH (ref 43–77)
Platelets: 265 10*3/uL (ref 150–400)
RBC: 5.39 MIL/uL — ABNORMAL HIGH (ref 3.87–5.11)
RDW: 13.6 % (ref 11.5–15.5)
WBC: 14.6 10*3/uL — ABNORMAL HIGH (ref 4.0–10.5)

## 2014-01-07 LAB — COMPREHENSIVE METABOLIC PANEL
ALBUMIN: 3.9 g/dL (ref 3.5–5.2)
ALT: 14 U/L (ref 0–35)
AST: 15 U/L (ref 0–37)
Alkaline Phosphatase: 135 U/L — ABNORMAL HIGH (ref 39–117)
BUN: 13 mg/dL (ref 6–23)
CO2: 24 meq/L (ref 19–32)
CREATININE: 0.91 mg/dL (ref 0.50–1.10)
Calcium: 9.5 mg/dL (ref 8.4–10.5)
Chloride: 100 mEq/L (ref 96–112)
GFR calc Af Amer: >90 mL/min (ref >90)
GFR, EST NON AFRICAN AMERICAN: 90 mL/min — AB (ref >90)
Glucose, Bld: 100 mg/dL — ABNORMAL HIGH (ref 70–99)
Potassium: 4.3 mEq/L (ref 3.7–5.3)
Sodium: 140 mEq/L (ref 137–147)
Total Bilirubin: 0.5 mg/dL (ref 0.3–1.2)
Total Protein: 7.4 g/dL (ref 6.0–8.3)

## 2014-01-07 LAB — URINE MICROSCOPIC-ADD ON

## 2014-01-07 LAB — URINALYSIS, ROUTINE W REFLEX MICROSCOPIC
Bilirubin Urine: NEGATIVE
Glucose, UA: NEGATIVE mg/dL
Ketones, ur: NEGATIVE mg/dL
Nitrite: POSITIVE — AB
Protein, ur: NEGATIVE mg/dL
SPECIFIC GRAVITY, URINE: 1.029 (ref 1.005–1.030)
UROBILINOGEN UA: 0.2 mg/dL (ref 0.0–1.0)
pH: 6 (ref 5.0–8.0)

## 2014-01-07 LAB — POCT PREGNANCY, URINE: PREG TEST UR: NEGATIVE

## 2014-01-07 LAB — LIPASE, BLOOD: Lipase: 17 U/L (ref 11–59)

## 2014-01-07 MED ORDER — IBUPROFEN 800 MG PO TABS
800.0000 mg | ORAL_TABLET | Freq: Once | ORAL | Status: AC
  Administered 2014-01-07: 800 mg via ORAL
  Filled 2014-01-07: qty 1

## 2014-01-07 MED ORDER — ONDANSETRON 4 MG PO TBDP
8.0000 mg | ORAL_TABLET | Freq: Once | ORAL | Status: AC
  Administered 2014-01-07: 8 mg via ORAL
  Filled 2014-01-07: qty 2
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
## 2014-01-07 NOTE — ED Notes (Signed)
Pt c/o headaches x 2 weeks, diarrhea and "violent" emesis today, accompanied by body aches.

## 2014-01-08 MED ORDER — CIPROFLOXACIN HCL 250 MG PO TABS: 250.0000 mg | ORAL_TABLET | Freq: Two times a day (BID) | ORAL | Status: AC

## 2014-01-08 MED ORDER — ONDANSETRON HCL 4 MG PO TABS: 4.0000 mg | ORAL_TABLET | Freq: Four times a day (QID) | ORAL | Status: AC

## 2014-01-08 NOTE — Discharge Instructions (Signed)
Urinary Tract Infection  Urinary tract infections (UTIs) can develop anywhere along your urinary tract. Your urinary tract is your body's drainage system for removing wastes and extra water. Your urinary tract includes two kidneys, two ureters, a bladder, and a urethra. Your kidneys are a pair of bean-shaped organs. Each kidney is about the size of your fist. They are located below your ribs, one on each side of your spine.  CAUSES  Infections are caused by microbes, which are microscopic organisms, including fungi, viruses, and bacteria. These organisms are so small that they can only be seen through a microscope. Bacteria are the microbes that most commonly cause UTIs.  SYMPTOMS   Symptoms of UTIs may vary by age and gender of the patient and by the location of the infection. Symptoms in young women typically include a frequent and intense urge to urinate and a painful, burning feeling in the bladder or urethra during urination. Older women and men are more likely to be tired, shaky, and weak and have muscle aches and abdominal pain. A fever may mean the infection is in your kidneys. Other symptoms of a kidney infection include pain in your back or sides below the ribs, nausea, and vomiting.  DIAGNOSIS  To diagnose a UTI, your caregiver will ask you about your symptoms. Your caregiver also will ask to provide a urine sample. The urine sample will be tested for bacteria and white blood cells. White blood cells are made by your body to help fight infection.  TREATMENT   Typically, UTIs can be treated with medication. Because most UTIs are caused by a bacterial infection, they usually can be treated with the use of antibiotics. The choice of antibiotic and length of treatment depend on your symptoms and the type of bacteria causing your infection.  HOME CARE INSTRUCTIONS   If you were prescribed antibiotics, take them exactly as your caregiver instructs you. Finish the medication even if you feel better after you  have only taken some of the medication.   Drink enough water and fluids to keep your urine clear or pale yellow.   Avoid caffeine, tea, and carbonated beverages. They tend to irritate your bladder.   Empty your bladder often. Avoid holding urine for long periods of time.   Empty your bladder before and after sexual intercourse.   After a bowel movement, women should cleanse from front to back. Use each tissue only once.  SEEK MEDICAL CARE IF:    You have back pain.   You develop a fever.   Your symptoms do not begin to resolve within 3 days.  SEEK IMMEDIATE MEDICAL CARE IF:    You have severe back pain or lower abdominal pain.   You develop chills.   You have nausea or vomiting.   You have continued burning or discomfort with urination.  MAKE SURE YOU:    Understand these instructions.   Will watch your condition.   Will get help right away if you are not doing well or get worse.  Document Released: 08/22/2005 Document Revised: 05/13/2012 Document Reviewed: 12/21/2011  [employer] Patient Information 2014 [employer]

## 2014-01-09 NOTE — ED Provider Notes (Signed)
Patient with myalgias nausea vomiting diarrhea. Lab work reassuring, however does have a UTI. We'll treat with antibiotics and cultures were sent. Patient's tolerate orals will be discharged home  Amy Clark. Rubin Payor, MD 01/09/14 1525

## 2014-01-10 LAB — URINE CULTURE: Colony Count: >=100000
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
## 2014-02-23 IMAGING — CR DG HIP (WITH OR WITHOUT PELVIS) 2-3V*L*
3 series · 3 of 3 positions shown · non-contrast
Comparison: 10/14/2013

CLINICAL DATA: Left femur fracture.

EXAM:
LEFT HIP - COMPLETE 2+ VIEW

[AP (1 of 2)]
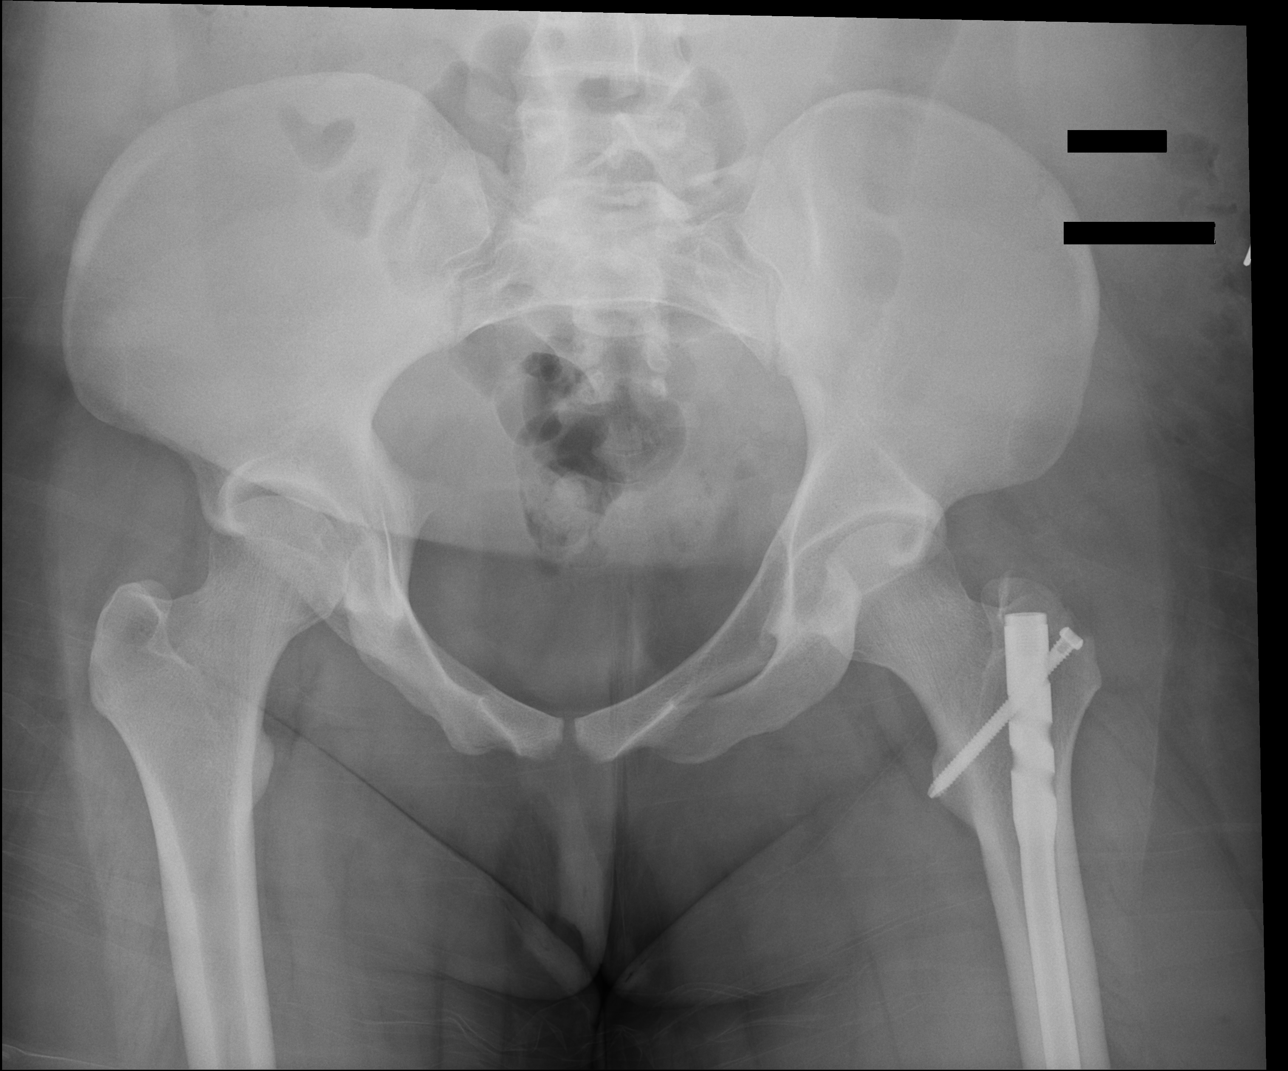

[AP (2 of 2)]
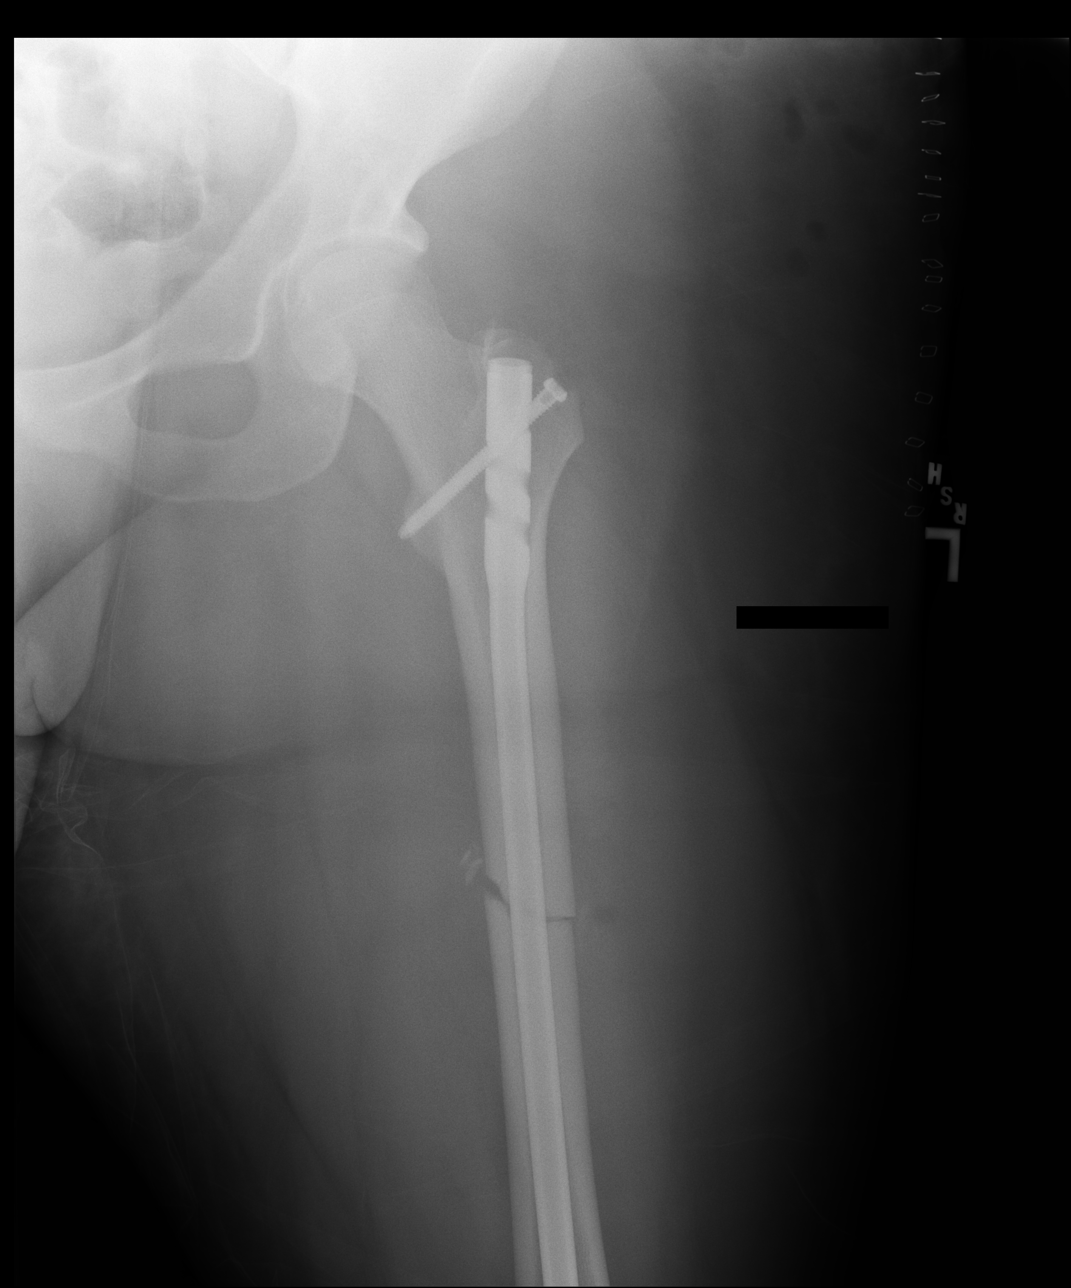

[lateral]
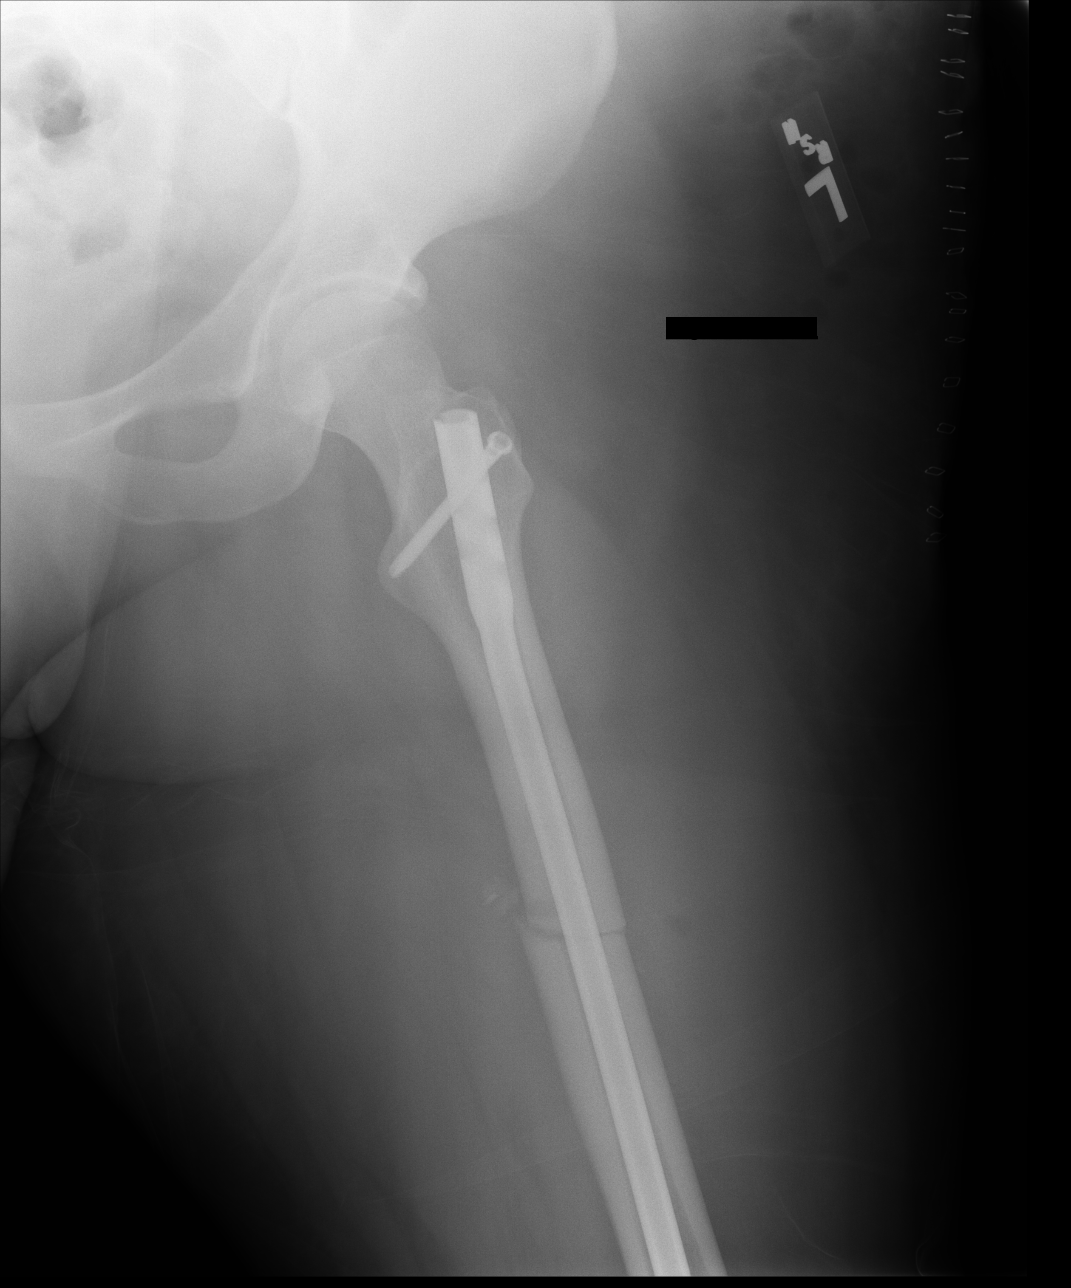

[3 of 3 positions shown; findings below may reference images not displayed]

FINDINGS: Internal fixation of the left femoral shaft fracture with an
intramedullary nail. Near anatomic alignment of the left femur after
internal fixation. There is a proximal interlocking screw in the
intertrochanteric region. Left hip is located. Pelvic bony ring is
intact. The distal aspect of the intramedullary nail is not imaged.
IMPRESSION: Internal fixation of the left femur fracture.

## 2014-12-09 ENCOUNTER — Encounter (HOSPITAL_COMMUNITY): Payer: Self-pay | Admitting: Specialist

## 2018-08-30 DIAGNOSIS — X58XXXA Exposure to other specified factors, initial encounter: Secondary | ICD-10-CM | POA: Diagnosis not present

## 2018-08-30 DIAGNOSIS — G8929 Other chronic pain: Secondary | ICD-10-CM | POA: Diagnosis not present

## 2018-08-30 DIAGNOSIS — M25512 Pain in left shoulder: Secondary | ICD-10-CM | POA: Diagnosis not present

## 2018-08-30 DIAGNOSIS — Y998 Other external cause status: Secondary | ICD-10-CM | POA: Diagnosis not present

## 2018-08-30 DIAGNOSIS — F17200 Nicotine dependence, unspecified, uncomplicated: Secondary | ICD-10-CM | POA: Diagnosis not present

## 2018-08-30 DIAGNOSIS — S161XXA Strain of muscle, fascia and tendon at neck level, initial encounter: Secondary | ICD-10-CM | POA: Diagnosis not present

## 2018-08-30 DIAGNOSIS — Y9389 Activity, other specified: Secondary | ICD-10-CM | POA: Diagnosis not present

## 2018-08-30 DIAGNOSIS — Y9289 Other specified places as the place of occurrence of the external cause: Secondary | ICD-10-CM | POA: Diagnosis not present

## 2018-08-30 DIAGNOSIS — F419 Anxiety disorder, unspecified: Secondary | ICD-10-CM | POA: Diagnosis not present

## 2018-09-10 DIAGNOSIS — M7552 Bursitis of left shoulder: Secondary | ICD-10-CM | POA: Diagnosis not present

## 2018-09-10 DIAGNOSIS — G5602 Carpal tunnel syndrome, left upper limb: Secondary | ICD-10-CM | POA: Diagnosis not present

## 2018-09-15 DIAGNOSIS — S4992XA Unspecified injury of left shoulder and upper arm, initial encounter: Secondary | ICD-10-CM | POA: Diagnosis not present

## 2018-09-15 DIAGNOSIS — X509XXA Other and unspecified overexertion or strenuous movements or postures, initial encounter: Secondary | ICD-10-CM | POA: Diagnosis not present

## 2018-09-15 DIAGNOSIS — S43402A Unspecified sprain of left shoulder joint, initial encounter: Secondary | ICD-10-CM | POA: Diagnosis not present

## 2018-09-15 DIAGNOSIS — Z882 Allergy status to sulfonamides status: Secondary | ICD-10-CM | POA: Diagnosis not present

## 2018-09-15 DIAGNOSIS — S46912A Strain of unspecified muscle, fascia and tendon at shoulder and upper arm level, left arm, initial encounter: Secondary | ICD-10-CM | POA: Diagnosis not present

## 2018-09-15 DIAGNOSIS — F17210 Nicotine dependence, cigarettes, uncomplicated: Secondary | ICD-10-CM | POA: Diagnosis not present

## 2018-10-08 DIAGNOSIS — G5603 Carpal tunnel syndrome, bilateral upper limbs: Secondary | ICD-10-CM | POA: Diagnosis not present

## 2018-10-08 DIAGNOSIS — M25512 Pain in left shoulder: Secondary | ICD-10-CM | POA: Diagnosis not present

## 2018-10-08 DIAGNOSIS — M25532 Pain in left wrist: Secondary | ICD-10-CM | POA: Diagnosis not present

## 2018-10-27 DIAGNOSIS — Z6839 Body mass index (BMI) 39.0-39.9, adult: Secondary | ICD-10-CM | POA: Diagnosis not present

## 2018-10-27 DIAGNOSIS — G8921 Chronic pain due to trauma: Secondary | ICD-10-CM | POA: Diagnosis not present

## 2018-10-27 DIAGNOSIS — F325 Major depressive disorder, single episode, in full remission: Secondary | ICD-10-CM | POA: Diagnosis not present

## 2018-10-27 DIAGNOSIS — E669 Obesity, unspecified: Secondary | ICD-10-CM | POA: Diagnosis not present

## 2018-10-29 DIAGNOSIS — M7552 Bursitis of left shoulder: Secondary | ICD-10-CM | POA: Diagnosis not present

## 2018-10-29 DIAGNOSIS — M5441 Lumbago with sciatica, right side: Secondary | ICD-10-CM | POA: Diagnosis not present

## 2018-10-29 DIAGNOSIS — G8929 Other chronic pain: Secondary | ICD-10-CM | POA: Diagnosis not present

## 2018-10-29 DIAGNOSIS — M5442 Lumbago with sciatica, left side: Secondary | ICD-10-CM | POA: Diagnosis not present

## 2018-10-29 DIAGNOSIS — G5602 Carpal tunnel syndrome, left upper limb: Secondary | ICD-10-CM | POA: Diagnosis not present

## 2018-11-06 DIAGNOSIS — M25571 Pain in right ankle and joints of right foot: Secondary | ICD-10-CM | POA: Diagnosis not present

## 2018-11-06 DIAGNOSIS — S90511A Abrasion, right ankle, initial encounter: Secondary | ICD-10-CM | POA: Diagnosis not present

## 2018-11-06 DIAGNOSIS — Y929 Unspecified place or not applicable: Secondary | ICD-10-CM | POA: Diagnosis not present

## 2018-11-06 DIAGNOSIS — Y939 Activity, unspecified: Secondary | ICD-10-CM | POA: Diagnosis not present

## 2018-11-06 DIAGNOSIS — M79661 Pain in right lower leg: Secondary | ICD-10-CM | POA: Diagnosis not present

## 2018-11-06 DIAGNOSIS — S80211A Abrasion, right knee, initial encounter: Secondary | ICD-10-CM | POA: Diagnosis not present

## 2018-11-06 DIAGNOSIS — G8911 Acute pain due to trauma: Secondary | ICD-10-CM | POA: Diagnosis not present

## 2018-11-06 DIAGNOSIS — M79671 Pain in right foot: Secondary | ICD-10-CM | POA: Diagnosis not present

## 2018-11-06 DIAGNOSIS — M79604 Pain in right leg: Secondary | ICD-10-CM | POA: Diagnosis not present

## 2018-11-06 DIAGNOSIS — S90811A Abrasion, right foot, initial encounter: Secondary | ICD-10-CM | POA: Diagnosis not present

## 2018-11-06 DIAGNOSIS — X58XXXA Exposure to other specified factors, initial encounter: Secondary | ICD-10-CM | POA: Diagnosis not present

## 2018-11-24 DIAGNOSIS — H52223 Regular astigmatism, bilateral: Secondary | ICD-10-CM | POA: Diagnosis not present

## 2018-11-28 DIAGNOSIS — G5602 Carpal tunnel syndrome, left upper limb: Secondary | ICD-10-CM | POA: Diagnosis not present

## 2018-11-28 DIAGNOSIS — Z882 Allergy status to sulfonamides status: Secondary | ICD-10-CM | POA: Diagnosis not present

## 2018-11-28 DIAGNOSIS — K219 Gastro-esophageal reflux disease without esophagitis: Secondary | ICD-10-CM | POA: Diagnosis not present

## 2018-11-28 DIAGNOSIS — F17210 Nicotine dependence, cigarettes, uncomplicated: Secondary | ICD-10-CM | POA: Diagnosis not present

## 2018-12-04 DIAGNOSIS — M5127 Other intervertebral disc displacement, lumbosacral region: Secondary | ICD-10-CM | POA: Diagnosis not present

## 2018-12-04 DIAGNOSIS — F4310 Post-traumatic stress disorder, unspecified: Secondary | ICD-10-CM | POA: Diagnosis not present

## 2018-12-04 DIAGNOSIS — M5416 Radiculopathy, lumbar region: Secondary | ICD-10-CM | POA: Diagnosis not present

## 2018-12-04 DIAGNOSIS — F17210 Nicotine dependence, cigarettes, uncomplicated: Secondary | ICD-10-CM | POA: Diagnosis not present

## 2018-12-04 DIAGNOSIS — Z79899 Other long term (current) drug therapy: Secondary | ICD-10-CM | POA: Diagnosis not present

## 2018-12-04 DIAGNOSIS — Z716 Tobacco abuse counseling: Secondary | ICD-10-CM | POA: Diagnosis not present

## 2018-12-04 DIAGNOSIS — Z1389 Encounter for screening for other disorder: Secondary | ICD-10-CM | POA: Diagnosis not present

## 2018-12-09 DIAGNOSIS — Z79899 Other long term (current) drug therapy: Secondary | ICD-10-CM | POA: Diagnosis not present

## 2018-12-09 DIAGNOSIS — F1120 Opioid dependence, uncomplicated: Secondary | ICD-10-CM | POA: Diagnosis not present

## 2018-12-09 DIAGNOSIS — M5416 Radiculopathy, lumbar region: Secondary | ICD-10-CM | POA: Diagnosis not present

## 2018-12-15 DIAGNOSIS — G5601 Carpal tunnel syndrome, right upper limb: Secondary | ICD-10-CM | POA: Diagnosis not present

## 2018-12-15 DIAGNOSIS — Z09 Encounter for follow-up examination after completed treatment for conditions other than malignant neoplasm: Secondary | ICD-10-CM | POA: Diagnosis not present

## 2018-12-18 DIAGNOSIS — M5416 Radiculopathy, lumbar region: Secondary | ICD-10-CM | POA: Diagnosis not present

## 2018-12-18 DIAGNOSIS — Z79899 Other long term (current) drug therapy: Secondary | ICD-10-CM | POA: Diagnosis not present

## 2018-12-18 DIAGNOSIS — F1220 Cannabis dependence, uncomplicated: Secondary | ICD-10-CM | POA: Diagnosis not present

## 2018-12-18 DIAGNOSIS — F4310 Post-traumatic stress disorder, unspecified: Secondary | ICD-10-CM | POA: Diagnosis not present

## 2018-12-18 DIAGNOSIS — F603 Borderline personality disorder: Secondary | ICD-10-CM | POA: Diagnosis not present

## 2018-12-18 DIAGNOSIS — F1120 Opioid dependence, uncomplicated: Secondary | ICD-10-CM | POA: Diagnosis not present

## 2018-12-18 DIAGNOSIS — M259 Joint disorder, unspecified: Secondary | ICD-10-CM | POA: Diagnosis not present

## 2018-12-18 DIAGNOSIS — F3181 Bipolar II disorder: Secondary | ICD-10-CM | POA: Diagnosis not present

## 2018-12-18 DIAGNOSIS — M5127 Other intervertebral disc displacement, lumbosacral region: Secondary | ICD-10-CM | POA: Diagnosis not present

## 2018-12-18 DIAGNOSIS — M792 Neuralgia and neuritis, unspecified: Secondary | ICD-10-CM | POA: Diagnosis not present

## 2018-12-18 DIAGNOSIS — F17200 Nicotine dependence, unspecified, uncomplicated: Secondary | ICD-10-CM | POA: Diagnosis not present

## 2018-12-18 DIAGNOSIS — M5136 Other intervertebral disc degeneration, lumbar region: Secondary | ICD-10-CM | POA: Diagnosis not present

## 2018-12-22 DIAGNOSIS — R05 Cough: Secondary | ICD-10-CM | POA: Diagnosis not present

## 2018-12-22 DIAGNOSIS — J020 Streptococcal pharyngitis: Secondary | ICD-10-CM | POA: Diagnosis not present

## 2018-12-22 DIAGNOSIS — R51 Headache: Secondary | ICD-10-CM | POA: Diagnosis not present

## 2018-12-23 DIAGNOSIS — Z79899 Other long term (current) drug therapy: Secondary | ICD-10-CM | POA: Diagnosis not present

## 2018-12-23 DIAGNOSIS — F1120 Opioid dependence, uncomplicated: Secondary | ICD-10-CM | POA: Diagnosis not present

## 2018-12-26 DIAGNOSIS — K219 Gastro-esophageal reflux disease without esophagitis: Secondary | ICD-10-CM | POA: Diagnosis not present

## 2018-12-26 DIAGNOSIS — G5601 Carpal tunnel syndrome, right upper limb: Secondary | ICD-10-CM | POA: Diagnosis not present

## 2018-12-26 DIAGNOSIS — F17210 Nicotine dependence, cigarettes, uncomplicated: Secondary | ICD-10-CM | POA: Diagnosis not present

## 2018-12-26 DIAGNOSIS — Z882 Allergy status to sulfonamides status: Secondary | ICD-10-CM | POA: Diagnosis not present

## 2019-01-05 DIAGNOSIS — M545 Low back pain: Secondary | ICD-10-CM | POA: Diagnosis not present

## 2019-01-15 DIAGNOSIS — M7918 Myalgia, other site: Secondary | ICD-10-CM | POA: Diagnosis not present

## 2019-01-15 DIAGNOSIS — F4310 Post-traumatic stress disorder, unspecified: Secondary | ICD-10-CM | POA: Diagnosis not present

## 2019-01-15 DIAGNOSIS — Z79899 Other long term (current) drug therapy: Secondary | ICD-10-CM | POA: Diagnosis not present

## 2019-01-15 DIAGNOSIS — F17200 Nicotine dependence, unspecified, uncomplicated: Secondary | ICD-10-CM | POA: Diagnosis not present

## 2019-01-15 DIAGNOSIS — M47817 Spondylosis without myelopathy or radiculopathy, lumbosacral region: Secondary | ICD-10-CM | POA: Diagnosis not present

## 2019-01-15 DIAGNOSIS — F17210 Nicotine dependence, cigarettes, uncomplicated: Secondary | ICD-10-CM | POA: Diagnosis not present

## 2019-01-15 DIAGNOSIS — F603 Borderline personality disorder: Secondary | ICD-10-CM | POA: Diagnosis not present

## 2019-01-15 DIAGNOSIS — F3181 Bipolar II disorder: Secondary | ICD-10-CM | POA: Diagnosis not present

## 2019-01-15 DIAGNOSIS — F419 Anxiety disorder, unspecified: Secondary | ICD-10-CM | POA: Diagnosis not present

## 2019-01-15 DIAGNOSIS — M5126 Other intervertebral disc displacement, lumbar region: Secondary | ICD-10-CM | POA: Diagnosis not present

## 2019-01-15 DIAGNOSIS — F1220 Cannabis dependence, uncomplicated: Secondary | ICD-10-CM | POA: Diagnosis not present

## 2019-01-15 DIAGNOSIS — Z79891 Long term (current) use of opiate analgesic: Secondary | ICD-10-CM | POA: Diagnosis not present

## 2019-01-15 DIAGNOSIS — Z716 Tobacco abuse counseling: Secondary | ICD-10-CM | POA: Diagnosis not present

## 2019-01-15 DIAGNOSIS — F1120 Opioid dependence, uncomplicated: Secondary | ICD-10-CM | POA: Diagnosis not present

## 2019-01-20 DIAGNOSIS — Z79899 Other long term (current) drug therapy: Secondary | ICD-10-CM | POA: Diagnosis not present

## 2019-01-20 DIAGNOSIS — F1120 Opioid dependence, uncomplicated: Secondary | ICD-10-CM | POA: Diagnosis not present

## 2019-01-20 DIAGNOSIS — M545 Low back pain: Secondary | ICD-10-CM | POA: Diagnosis not present

## 2019-01-21 DIAGNOSIS — F1120 Opioid dependence, uncomplicated: Secondary | ICD-10-CM | POA: Diagnosis not present

## 2019-01-21 DIAGNOSIS — F3181 Bipolar II disorder: Secondary | ICD-10-CM | POA: Diagnosis not present

## 2019-01-21 DIAGNOSIS — F1220 Cannabis dependence, uncomplicated: Secondary | ICD-10-CM | POA: Diagnosis not present

## 2019-01-21 DIAGNOSIS — F17200 Nicotine dependence, unspecified, uncomplicated: Secondary | ICD-10-CM | POA: Diagnosis not present

## 2019-01-21 DIAGNOSIS — F603 Borderline personality disorder: Secondary | ICD-10-CM | POA: Diagnosis not present

## 2019-01-26 DIAGNOSIS — Z79899 Other long term (current) drug therapy: Secondary | ICD-10-CM | POA: Diagnosis not present

## 2019-01-26 DIAGNOSIS — F1120 Opioid dependence, uncomplicated: Secondary | ICD-10-CM | POA: Diagnosis not present

## 2019-01-29 DIAGNOSIS — F17200 Nicotine dependence, unspecified, uncomplicated: Secondary | ICD-10-CM | POA: Diagnosis not present

## 2019-01-29 DIAGNOSIS — M25512 Pain in left shoulder: Secondary | ICD-10-CM | POA: Diagnosis not present

## 2019-01-29 DIAGNOSIS — F1120 Opioid dependence, uncomplicated: Secondary | ICD-10-CM | POA: Diagnosis not present

## 2019-01-29 DIAGNOSIS — M48062 Spinal stenosis, lumbar region with neurogenic claudication: Secondary | ICD-10-CM | POA: Diagnosis not present

## 2019-01-29 DIAGNOSIS — M25572 Pain in left ankle and joints of left foot: Secondary | ICD-10-CM | POA: Diagnosis not present

## 2019-01-29 DIAGNOSIS — F3181 Bipolar II disorder: Secondary | ICD-10-CM | POA: Diagnosis not present

## 2019-01-29 DIAGNOSIS — M25561 Pain in right knee: Secondary | ICD-10-CM | POA: Diagnosis not present

## 2019-01-29 DIAGNOSIS — M545 Low back pain: Secondary | ICD-10-CM | POA: Diagnosis not present

## 2019-01-29 DIAGNOSIS — M25571 Pain in right ankle and joints of right foot: Secondary | ICD-10-CM | POA: Diagnosis not present

## 2019-01-29 DIAGNOSIS — M25562 Pain in left knee: Secondary | ICD-10-CM | POA: Diagnosis not present

## 2019-01-29 DIAGNOSIS — Z79899 Other long term (current) drug therapy: Secondary | ICD-10-CM | POA: Diagnosis not present

## 2019-01-29 DIAGNOSIS — F1220 Cannabis dependence, uncomplicated: Secondary | ICD-10-CM | POA: Diagnosis not present

## 2019-01-29 DIAGNOSIS — F603 Borderline personality disorder: Secondary | ICD-10-CM | POA: Diagnosis not present

## 2019-01-29 DIAGNOSIS — F4310 Post-traumatic stress disorder, unspecified: Secondary | ICD-10-CM | POA: Diagnosis not present

## 2019-02-03 DIAGNOSIS — F1120 Opioid dependence, uncomplicated: Secondary | ICD-10-CM | POA: Diagnosis not present

## 2019-02-03 DIAGNOSIS — Z79899 Other long term (current) drug therapy: Secondary | ICD-10-CM | POA: Diagnosis not present

## 2019-02-03 DIAGNOSIS — M545 Low back pain: Secondary | ICD-10-CM | POA: Diagnosis not present

## 2019-02-09 DIAGNOSIS — M545 Low back pain: Secondary | ICD-10-CM | POA: Diagnosis not present

## 2019-02-09 DIAGNOSIS — F4310 Post-traumatic stress disorder, unspecified: Secondary | ICD-10-CM | POA: Diagnosis not present

## 2019-02-09 DIAGNOSIS — M5416 Radiculopathy, lumbar region: Secondary | ICD-10-CM | POA: Diagnosis not present

## 2019-02-09 DIAGNOSIS — M5126 Other intervertebral disc displacement, lumbar region: Secondary | ICD-10-CM | POA: Diagnosis not present

## 2019-02-09 DIAGNOSIS — Z79899 Other long term (current) drug therapy: Secondary | ICD-10-CM | POA: Diagnosis not present

## 2019-02-09 DIAGNOSIS — Z79891 Long term (current) use of opiate analgesic: Secondary | ICD-10-CM | POA: Diagnosis not present

## 2019-02-09 DIAGNOSIS — M48062 Spinal stenosis, lumbar region with neurogenic claudication: Secondary | ICD-10-CM | POA: Diagnosis not present

## 2019-02-09 DIAGNOSIS — M47817 Spondylosis without myelopathy or radiculopathy, lumbosacral region: Secondary | ICD-10-CM | POA: Diagnosis not present

## 2019-02-23 DIAGNOSIS — M47817 Spondylosis without myelopathy or radiculopathy, lumbosacral region: Secondary | ICD-10-CM | POA: Diagnosis not present

## 2019-02-23 DIAGNOSIS — M545 Low back pain: Secondary | ICD-10-CM | POA: Diagnosis not present

## 2019-03-12 DIAGNOSIS — M5416 Radiculopathy, lumbar region: Secondary | ICD-10-CM | POA: Diagnosis not present

## 2019-03-12 DIAGNOSIS — Z9119 Patient's noncompliance with other medical treatment and regimen: Secondary | ICD-10-CM | POA: Diagnosis not present

## 2019-03-12 DIAGNOSIS — M47817 Spondylosis without myelopathy or radiculopathy, lumbosacral region: Secondary | ICD-10-CM | POA: Diagnosis not present

## 2019-03-12 DIAGNOSIS — G8929 Other chronic pain: Secondary | ICD-10-CM | POA: Diagnosis not present

## 2019-03-12 DIAGNOSIS — Z716 Tobacco abuse counseling: Secondary | ICD-10-CM | POA: Diagnosis not present

## 2019-03-12 DIAGNOSIS — Z79899 Other long term (current) drug therapy: Secondary | ICD-10-CM | POA: Diagnosis not present

## 2019-03-12 DIAGNOSIS — F17210 Nicotine dependence, cigarettes, uncomplicated: Secondary | ICD-10-CM | POA: Diagnosis not present

## 2019-03-24 DIAGNOSIS — Z79891 Long term (current) use of opiate analgesic: Secondary | ICD-10-CM | POA: Diagnosis not present

## 2019-03-24 DIAGNOSIS — M48062 Spinal stenosis, lumbar region with neurogenic claudication: Secondary | ICD-10-CM | POA: Diagnosis not present

## 2019-03-24 DIAGNOSIS — M5127 Other intervertebral disc displacement, lumbosacral region: Secondary | ICD-10-CM | POA: Diagnosis not present

## 2019-03-24 DIAGNOSIS — M545 Low back pain: Secondary | ICD-10-CM | POA: Diagnosis not present

## 2019-03-24 DIAGNOSIS — M7918 Myalgia, other site: Secondary | ICD-10-CM | POA: Diagnosis not present

## 2019-03-24 DIAGNOSIS — M5416 Radiculopathy, lumbar region: Secondary | ICD-10-CM | POA: Diagnosis not present

## 2019-03-24 DIAGNOSIS — M47817 Spondylosis without myelopathy or radiculopathy, lumbosacral region: Secondary | ICD-10-CM | POA: Diagnosis not present

## 2019-03-24 DIAGNOSIS — F4310 Post-traumatic stress disorder, unspecified: Secondary | ICD-10-CM | POA: Diagnosis not present

## 2019-03-30 DIAGNOSIS — L680 Hirsutism: Secondary | ICD-10-CM | POA: Diagnosis not present

## 2019-03-30 DIAGNOSIS — Z6841 Body Mass Index (BMI) 40.0 and over, adult: Secondary | ICD-10-CM | POA: Diagnosis not present

## 2019-03-30 DIAGNOSIS — E282 Polycystic ovarian syndrome: Secondary | ICD-10-CM | POA: Diagnosis not present

## 2019-03-30 DIAGNOSIS — Z79899 Other long term (current) drug therapy: Secondary | ICD-10-CM | POA: Diagnosis not present

## 2019-03-30 DIAGNOSIS — D649 Anemia, unspecified: Secondary | ICD-10-CM | POA: Diagnosis not present

## 2019-03-30 DIAGNOSIS — F1120 Opioid dependence, uncomplicated: Secondary | ICD-10-CM | POA: Diagnosis not present

## 2019-03-30 DIAGNOSIS — F329 Major depressive disorder, single episode, unspecified: Secondary | ICD-10-CM | POA: Diagnosis not present

## 2019-03-30 DIAGNOSIS — F419 Anxiety disorder, unspecified: Secondary | ICD-10-CM | POA: Diagnosis not present

## 2019-04-06 DIAGNOSIS — G629 Polyneuropathy, unspecified: Secondary | ICD-10-CM | POA: Diagnosis not present

## 2019-04-06 DIAGNOSIS — D509 Iron deficiency anemia, unspecified: Secondary | ICD-10-CM | POA: Diagnosis not present

## 2019-04-06 DIAGNOSIS — F5102 Adjustment insomnia: Secondary | ICD-10-CM | POA: Diagnosis not present

## 2019-04-06 DIAGNOSIS — E282 Polycystic ovarian syndrome: Secondary | ICD-10-CM | POA: Diagnosis not present

## 2019-04-13 DIAGNOSIS — Z716 Tobacco abuse counseling: Secondary | ICD-10-CM | POA: Diagnosis not present

## 2019-04-13 DIAGNOSIS — M7918 Myalgia, other site: Secondary | ICD-10-CM | POA: Diagnosis not present

## 2019-04-13 DIAGNOSIS — Z79891 Long term (current) use of opiate analgesic: Secondary | ICD-10-CM | POA: Diagnosis not present

## 2019-04-13 DIAGNOSIS — Z79899 Other long term (current) drug therapy: Secondary | ICD-10-CM | POA: Diagnosis not present

## 2019-04-13 DIAGNOSIS — M5416 Radiculopathy, lumbar region: Secondary | ICD-10-CM | POA: Diagnosis not present

## 2019-04-13 DIAGNOSIS — F17210 Nicotine dependence, cigarettes, uncomplicated: Secondary | ICD-10-CM | POA: Diagnosis not present

## 2019-04-13 DIAGNOSIS — M545 Low back pain: Secondary | ICD-10-CM | POA: Diagnosis not present

## 2019-04-23 DIAGNOSIS — M797 Fibromyalgia: Secondary | ICD-10-CM | POA: Diagnosis not present

## 2019-04-23 DIAGNOSIS — M069 Rheumatoid arthritis, unspecified: Secondary | ICD-10-CM | POA: Diagnosis not present

## 2019-04-23 DIAGNOSIS — D509 Iron deficiency anemia, unspecified: Secondary | ICD-10-CM | POA: Diagnosis not present

## 2019-04-23 DIAGNOSIS — E282 Polycystic ovarian syndrome: Secondary | ICD-10-CM | POA: Diagnosis not present

## 2019-04-23 DIAGNOSIS — F5102 Adjustment insomnia: Secondary | ICD-10-CM | POA: Diagnosis not present

## 2019-04-23 DIAGNOSIS — M2550 Pain in unspecified joint: Secondary | ICD-10-CM | POA: Diagnosis not present

## 2019-04-23 DIAGNOSIS — G629 Polyneuropathy, unspecified: Secondary | ICD-10-CM | POA: Diagnosis not present

## 2019-04-29 DIAGNOSIS — H9313 Tinnitus, bilateral: Secondary | ICD-10-CM | POA: Diagnosis not present

## 2019-04-29 DIAGNOSIS — H9041 Sensorineural hearing loss, unilateral, right ear, with unrestricted hearing on the contralateral side: Secondary | ICD-10-CM | POA: Diagnosis not present

## 2019-05-11 DIAGNOSIS — M545 Low back pain: Secondary | ICD-10-CM | POA: Diagnosis not present

## 2019-05-11 DIAGNOSIS — Z79899 Other long term (current) drug therapy: Secondary | ICD-10-CM | POA: Diagnosis not present

## 2019-05-11 DIAGNOSIS — M5416 Radiculopathy, lumbar region: Secondary | ICD-10-CM | POA: Diagnosis not present

## 2019-05-13 DIAGNOSIS — M2550 Pain in unspecified joint: Secondary | ICD-10-CM | POA: Diagnosis not present

## 2019-05-13 DIAGNOSIS — M25511 Pain in right shoulder: Secondary | ICD-10-CM | POA: Diagnosis not present

## 2019-05-13 DIAGNOSIS — F1120 Opioid dependence, uncomplicated: Secondary | ICD-10-CM | POA: Diagnosis not present

## 2019-05-13 DIAGNOSIS — M25512 Pain in left shoulder: Secondary | ICD-10-CM | POA: Diagnosis not present

## 2019-05-13 DIAGNOSIS — G4730 Sleep apnea, unspecified: Secondary | ICD-10-CM | POA: Diagnosis not present

## 2019-05-13 DIAGNOSIS — Z79899 Other long term (current) drug therapy: Secondary | ICD-10-CM | POA: Diagnosis not present

## 2019-05-13 DIAGNOSIS — M545 Low back pain: Secondary | ICD-10-CM | POA: Diagnosis not present

## 2019-06-09 DIAGNOSIS — R0681 Apnea, not elsewhere classified: Secondary | ICD-10-CM | POA: Diagnosis not present

## 2019-06-09 DIAGNOSIS — R0683 Snoring: Secondary | ICD-10-CM | POA: Diagnosis not present

## 2019-06-11 DIAGNOSIS — Z79899 Other long term (current) drug therapy: Secondary | ICD-10-CM | POA: Diagnosis not present

## 2019-06-11 DIAGNOSIS — M5416 Radiculopathy, lumbar region: Secondary | ICD-10-CM | POA: Diagnosis not present

## 2019-06-11 DIAGNOSIS — Z79891 Long term (current) use of opiate analgesic: Secondary | ICD-10-CM | POA: Diagnosis not present

## 2019-06-11 DIAGNOSIS — M545 Low back pain: Secondary | ICD-10-CM | POA: Diagnosis not present

## 2019-06-11 DIAGNOSIS — Z1389 Encounter for screening for other disorder: Secondary | ICD-10-CM | POA: Diagnosis not present

## 2019-06-19 DIAGNOSIS — R112 Nausea with vomiting, unspecified: Secondary | ICD-10-CM | POA: Diagnosis not present

## 2019-06-19 DIAGNOSIS — F17200 Nicotine dependence, unspecified, uncomplicated: Secondary | ICD-10-CM | POA: Diagnosis not present

## 2019-06-19 DIAGNOSIS — F329 Major depressive disorder, single episode, unspecified: Secondary | ICD-10-CM | POA: Diagnosis not present

## 2019-06-19 DIAGNOSIS — M1990 Unspecified osteoarthritis, unspecified site: Secondary | ICD-10-CM | POA: Diagnosis not present

## 2019-06-19 DIAGNOSIS — E282 Polycystic ovarian syndrome: Secondary | ICD-10-CM | POA: Diagnosis not present

## 2019-06-19 DIAGNOSIS — R51 Headache: Secondary | ICD-10-CM | POA: Diagnosis not present

## 2019-06-19 DIAGNOSIS — G8929 Other chronic pain: Secondary | ICD-10-CM | POA: Diagnosis not present

## 2019-06-19 DIAGNOSIS — M549 Dorsalgia, unspecified: Secondary | ICD-10-CM | POA: Diagnosis not present

## 2019-06-19 DIAGNOSIS — F419 Anxiety disorder, unspecified: Secondary | ICD-10-CM | POA: Diagnosis not present

## 2019-06-25 DIAGNOSIS — G4730 Sleep apnea, unspecified: Secondary | ICD-10-CM | POA: Diagnosis not present

## 2019-06-25 DIAGNOSIS — E282 Polycystic ovarian syndrome: Secondary | ICD-10-CM | POA: Diagnosis not present

## 2019-06-25 DIAGNOSIS — R7989 Other specified abnormal findings of blood chemistry: Secondary | ICD-10-CM | POA: Diagnosis not present

## 2019-06-25 DIAGNOSIS — M2550 Pain in unspecified joint: Secondary | ICD-10-CM | POA: Diagnosis not present
